# Patient Record
Sex: Female | Born: 1955 | Race: White | Hispanic: No | Marital: Married | Smoking: Never smoker
Health system: Southern US, Community
[De-identification: ages and names within clinical notes are randomized; demographics above are authoritative.]

## PROBLEM LIST (undated history)

## (undated) DIAGNOSIS — K222 Esophageal obstruction: Secondary | ICD-10-CM

## (undated) DIAGNOSIS — M1990 Unspecified osteoarthritis, unspecified site: Secondary | ICD-10-CM

## (undated) DIAGNOSIS — J189 Pneumonia, unspecified organism: Secondary | ICD-10-CM

## (undated) DIAGNOSIS — R7989 Other specified abnormal findings of blood chemistry: Secondary | ICD-10-CM

## (undated) DIAGNOSIS — E0590 Thyrotoxicosis, unspecified without thyrotoxic crisis or storm: Secondary | ICD-10-CM

## (undated) DIAGNOSIS — M8580 Other specified disorders of bone density and structure, unspecified site: Secondary | ICD-10-CM

## (undated) DIAGNOSIS — I10 Essential (primary) hypertension: Secondary | ICD-10-CM

## (undated) DIAGNOSIS — K224 Dyskinesia of esophagus: Secondary | ICD-10-CM

## (undated) DIAGNOSIS — F32A Depression, unspecified: Secondary | ICD-10-CM

## (undated) DIAGNOSIS — E785 Hyperlipidemia, unspecified: Secondary | ICD-10-CM

## (undated) DIAGNOSIS — K317 Polyp of stomach and duodenum: Secondary | ICD-10-CM

## (undated) DIAGNOSIS — Z8709 Personal history of other diseases of the respiratory system: Secondary | ICD-10-CM

## (undated) DIAGNOSIS — F329 Major depressive disorder, single episode, unspecified: Secondary | ICD-10-CM

## (undated) DIAGNOSIS — E0500 Thyrotoxicosis with diffuse goiter without thyrotoxic crisis or storm: Secondary | ICD-10-CM

## (undated) DIAGNOSIS — R55 Syncope and collapse: Secondary | ICD-10-CM

## (undated) DIAGNOSIS — Z9889 Other specified postprocedural states: Secondary | ICD-10-CM

## (undated) DIAGNOSIS — R945 Abnormal results of liver function studies: Secondary | ICD-10-CM

## (undated) DIAGNOSIS — K219 Gastro-esophageal reflux disease without esophagitis: Secondary | ICD-10-CM

## (undated) DIAGNOSIS — F419 Anxiety disorder, unspecified: Secondary | ICD-10-CM

## (undated) DIAGNOSIS — R112 Nausea with vomiting, unspecified: Secondary | ICD-10-CM

## (undated) DIAGNOSIS — Z8719 Personal history of other diseases of the digestive system: Secondary | ICD-10-CM

## (undated) DIAGNOSIS — Z8489 Family history of other specified conditions: Secondary | ICD-10-CM

## (undated) HISTORY — PX: CARPAL TUNNEL RELEASE: SHX101

## (undated) HISTORY — DX: Depression, unspecified: F32.A

## (undated) HISTORY — DX: Gastro-esophageal reflux disease without esophagitis: K21.9

## (undated) HISTORY — DX: Thyrotoxicosis, unspecified without thyrotoxic crisis or storm: E05.90

## (undated) HISTORY — DX: Other specified abnormal findings of blood chemistry: R79.89

## (undated) HISTORY — DX: Polyp of stomach and duodenum: K31.7

## (undated) HISTORY — PX: BLADDER SURGERY: SHX569

## (undated) HISTORY — PX: ABDOMINAL HYSTERECTOMY: SHX81

## (undated) HISTORY — DX: Hyperlipidemia, unspecified: E78.5

## (undated) HISTORY — DX: Dyskinesia of esophagus: K22.4

## (undated) HISTORY — DX: Essential (primary) hypertension: I10

## (undated) HISTORY — DX: Esophageal obstruction: K22.2

## (undated) HISTORY — PX: KNEE SURGERY: SHX244

## (undated) HISTORY — PX: ESOPHAGEAL DILATION: SHX303

## (undated) HISTORY — PX: NASAL SEPTUM SURGERY: SHX37

## (undated) HISTORY — DX: Thyrotoxicosis with diffuse goiter without thyrotoxic crisis or storm: E05.00

## (undated) HISTORY — DX: Major depressive disorder, single episode, unspecified: F32.9

## (undated) HISTORY — DX: Abnormal results of liver function studies: R94.5

---

## 1978-11-19 HISTORY — PX: CARPAL TUNNEL RELEASE: SHX101

## 1995-03-21 HISTORY — PX: CHOLECYSTECTOMY: SHX55

## 1997-03-20 HISTORY — PX: ELBOW SURGERY: SHX618

## 1997-07-15 ENCOUNTER — Ambulatory Visit (HOSPITAL_COMMUNITY): Admission: RE | Admit: 1997-07-15 | Discharge: 1997-07-15 | Payer: Self-pay | Admitting: Orthopedic Surgery

## 1997-07-31 ENCOUNTER — Ambulatory Visit (HOSPITAL_COMMUNITY): Admission: RE | Admit: 1997-07-31 | Discharge: 1997-07-31 | Payer: Self-pay | Admitting: Neurosurgery

## 1997-08-05 ENCOUNTER — Other Ambulatory Visit: Admission: RE | Admit: 1997-08-05 | Discharge: 1997-08-05 | Payer: Self-pay | Admitting: Obstetrics & Gynecology

## 1997-08-20 ENCOUNTER — Ambulatory Visit (HOSPITAL_COMMUNITY): Admission: RE | Admit: 1997-08-20 | Discharge: 1997-08-20 | Payer: Self-pay | Admitting: Neurosurgery

## 1997-09-03 ENCOUNTER — Ambulatory Visit (HOSPITAL_COMMUNITY): Admission: RE | Admit: 1997-09-03 | Discharge: 1997-09-03 | Payer: Self-pay | Admitting: Neurosurgery

## 1997-10-13 ENCOUNTER — Ambulatory Visit (HOSPITAL_COMMUNITY): Admission: RE | Admit: 1997-10-13 | Discharge: 1997-10-13 | Payer: Self-pay | Admitting: Internal Medicine

## 1998-03-29 ENCOUNTER — Ambulatory Visit (HOSPITAL_COMMUNITY): Admission: RE | Admit: 1998-03-29 | Discharge: 1998-03-29 | Payer: Self-pay | Admitting: Neurosurgery

## 1998-03-29 ENCOUNTER — Encounter: Payer: Self-pay | Admitting: Neurosurgery

## 1998-05-17 ENCOUNTER — Ambulatory Visit (HOSPITAL_COMMUNITY): Admission: RE | Admit: 1998-05-17 | Discharge: 1998-05-17 | Payer: Self-pay | Admitting: Neurology

## 2000-01-05 ENCOUNTER — Ambulatory Visit (HOSPITAL_COMMUNITY): Admission: RE | Admit: 2000-01-05 | Discharge: 2000-01-05 | Payer: Self-pay | Admitting: Neurosurgery

## 2000-03-16 ENCOUNTER — Emergency Department (HOSPITAL_COMMUNITY): Admission: EM | Admit: 2000-03-16 | Discharge: 2000-03-16 | Payer: Self-pay | Admitting: Emergency Medicine

## 2000-03-16 ENCOUNTER — Encounter: Payer: Self-pay | Admitting: Emergency Medicine

## 2000-06-11 ENCOUNTER — Encounter: Payer: Self-pay | Admitting: Emergency Medicine

## 2000-06-11 ENCOUNTER — Observation Stay (HOSPITAL_COMMUNITY): Admission: EM | Admit: 2000-06-11 | Discharge: 2000-06-12 | Payer: Self-pay

## 2000-06-14 ENCOUNTER — Encounter: Payer: Self-pay | Admitting: Emergency Medicine

## 2000-06-14 ENCOUNTER — Emergency Department (HOSPITAL_COMMUNITY): Admission: EM | Admit: 2000-06-14 | Discharge: 2000-06-14 | Payer: Self-pay | Admitting: Emergency Medicine

## 2000-08-07 ENCOUNTER — Other Ambulatory Visit: Admission: RE | Admit: 2000-08-07 | Discharge: 2000-08-07 | Payer: Self-pay | Admitting: Otolaryngology

## 2000-09-13 ENCOUNTER — Encounter: Payer: Self-pay | Admitting: Obstetrics and Gynecology

## 2000-09-13 ENCOUNTER — Ambulatory Visit (HOSPITAL_COMMUNITY): Admission: RE | Admit: 2000-09-13 | Discharge: 2000-09-13 | Payer: Self-pay | Admitting: Obstetrics and Gynecology

## 2000-09-21 ENCOUNTER — Encounter: Admission: RE | Admit: 2000-09-21 | Discharge: 2000-09-21 | Payer: Self-pay | Admitting: Obstetrics and Gynecology

## 2000-09-21 ENCOUNTER — Encounter: Payer: Self-pay | Admitting: Obstetrics and Gynecology

## 2001-02-28 ENCOUNTER — Ambulatory Visit (HOSPITAL_BASED_OUTPATIENT_CLINIC_OR_DEPARTMENT_OTHER): Admission: RE | Admit: 2001-02-28 | Discharge: 2001-02-28 | Payer: Self-pay | Admitting: Plastic Surgery

## 2001-10-11 ENCOUNTER — Ambulatory Visit (HOSPITAL_COMMUNITY): Admission: RE | Admit: 2001-10-11 | Discharge: 2001-10-11 | Payer: Self-pay | Admitting: Internal Medicine

## 2001-10-11 ENCOUNTER — Encounter (INDEPENDENT_AMBULATORY_CARE_PROVIDER_SITE_OTHER): Payer: Self-pay | Admitting: Specialist

## 2003-08-25 ENCOUNTER — Ambulatory Visit (HOSPITAL_COMMUNITY): Admission: RE | Admit: 2003-08-25 | Discharge: 2003-08-25 | Payer: Self-pay | Admitting: Internal Medicine

## 2003-08-28 ENCOUNTER — Ambulatory Visit (HOSPITAL_COMMUNITY): Admission: RE | Admit: 2003-08-28 | Discharge: 2003-08-28 | Payer: Self-pay | Admitting: Internal Medicine

## 2003-08-28 ENCOUNTER — Encounter: Payer: Self-pay | Admitting: Internal Medicine

## 2004-01-19 ENCOUNTER — Encounter: Payer: Self-pay | Admitting: Internal Medicine

## 2004-03-20 HISTORY — PX: FRACTURE SURGERY: SHX138

## 2004-05-05 ENCOUNTER — Other Ambulatory Visit: Admission: RE | Admit: 2004-05-05 | Discharge: 2004-05-05 | Payer: Self-pay | Admitting: Obstetrics and Gynecology

## 2004-05-18 ENCOUNTER — Emergency Department (HOSPITAL_COMMUNITY): Admission: EM | Admit: 2004-05-18 | Discharge: 2004-05-18 | Payer: Self-pay | Admitting: Emergency Medicine

## 2004-05-27 ENCOUNTER — Ambulatory Visit: Payer: Self-pay | Admitting: Internal Medicine

## 2004-09-05 ENCOUNTER — Encounter: Admission: RE | Admit: 2004-09-05 | Discharge: 2004-09-05 | Payer: Self-pay | Admitting: Neurosurgery

## 2007-04-29 ENCOUNTER — Ambulatory Visit: Payer: Self-pay | Admitting: Internal Medicine

## 2007-07-31 ENCOUNTER — Telehealth: Payer: Self-pay | Admitting: Internal Medicine

## 2007-08-08 ENCOUNTER — Telehealth: Payer: Self-pay | Admitting: Internal Medicine

## 2007-08-26 ENCOUNTER — Encounter: Admission: RE | Admit: 2007-08-26 | Discharge: 2007-08-26 | Payer: Self-pay | Admitting: Otolaryngology

## 2008-02-08 ENCOUNTER — Emergency Department (HOSPITAL_COMMUNITY): Admission: EM | Admit: 2008-02-08 | Discharge: 2008-02-08 | Payer: Self-pay | Admitting: Emergency Medicine

## 2008-02-10 ENCOUNTER — Emergency Department (HOSPITAL_COMMUNITY): Admission: EM | Admit: 2008-02-10 | Discharge: 2008-02-10 | Payer: Self-pay | Admitting: Emergency Medicine

## 2008-04-04 ENCOUNTER — Encounter: Admission: RE | Admit: 2008-04-04 | Discharge: 2008-04-04 | Payer: Self-pay | Admitting: Internal Medicine

## 2009-01-20 ENCOUNTER — Telehealth: Payer: Self-pay | Admitting: Internal Medicine

## 2009-01-20 DIAGNOSIS — Z862 Personal history of diseases of the blood and blood-forming organs and certain disorders involving the immune mechanism: Secondary | ICD-10-CM | POA: Insufficient documentation

## 2009-01-20 DIAGNOSIS — F410 Panic disorder [episodic paroxysmal anxiety] without agoraphobia: Secondary | ICD-10-CM | POA: Insufficient documentation

## 2009-01-20 DIAGNOSIS — K2970 Gastritis, unspecified, without bleeding: Secondary | ICD-10-CM | POA: Insufficient documentation

## 2009-01-20 DIAGNOSIS — D131 Benign neoplasm of stomach: Secondary | ICD-10-CM | POA: Insufficient documentation

## 2009-01-20 DIAGNOSIS — K2990 Gastroduodenitis, unspecified, without bleeding: Secondary | ICD-10-CM

## 2009-01-20 DIAGNOSIS — K222 Esophageal obstruction: Secondary | ICD-10-CM | POA: Insufficient documentation

## 2009-01-20 DIAGNOSIS — G5600 Carpal tunnel syndrome, unspecified upper limb: Secondary | ICD-10-CM | POA: Insufficient documentation

## 2009-01-20 DIAGNOSIS — K8020 Calculus of gallbladder without cholecystitis without obstruction: Secondary | ICD-10-CM | POA: Insufficient documentation

## 2009-01-20 DIAGNOSIS — K224 Dyskinesia of esophagus: Secondary | ICD-10-CM | POA: Insufficient documentation

## 2009-01-20 DIAGNOSIS — IMO0001 Reserved for inherently not codable concepts without codable children: Secondary | ICD-10-CM | POA: Insufficient documentation

## 2009-01-20 DIAGNOSIS — K219 Gastro-esophageal reflux disease without esophagitis: Secondary | ICD-10-CM | POA: Insufficient documentation

## 2009-01-20 DIAGNOSIS — Z8639 Personal history of other endocrine, nutritional and metabolic disease: Secondary | ICD-10-CM

## 2009-01-20 DIAGNOSIS — F329 Major depressive disorder, single episode, unspecified: Secondary | ICD-10-CM | POA: Insufficient documentation

## 2009-01-20 DIAGNOSIS — K589 Irritable bowel syndrome without diarrhea: Secondary | ICD-10-CM | POA: Insufficient documentation

## 2009-01-21 ENCOUNTER — Ambulatory Visit: Payer: Self-pay | Admitting: Internal Medicine

## 2009-01-21 DIAGNOSIS — R079 Chest pain, unspecified: Secondary | ICD-10-CM | POA: Insufficient documentation

## 2009-01-25 ENCOUNTER — Telehealth: Payer: Self-pay | Admitting: Internal Medicine

## 2009-01-27 ENCOUNTER — Ambulatory Visit: Payer: Self-pay | Admitting: Internal Medicine

## 2009-01-27 ENCOUNTER — Ambulatory Visit (HOSPITAL_COMMUNITY): Admission: RE | Admit: 2009-01-27 | Discharge: 2009-01-27 | Payer: Self-pay | Admitting: Internal Medicine

## 2009-02-01 ENCOUNTER — Encounter: Payer: Self-pay | Admitting: Internal Medicine

## 2009-02-19 ENCOUNTER — Telehealth: Payer: Self-pay | Admitting: Internal Medicine

## 2009-05-13 ENCOUNTER — Encounter: Payer: Self-pay | Admitting: Internal Medicine

## 2009-07-15 ENCOUNTER — Encounter: Payer: Self-pay | Admitting: Internal Medicine

## 2010-04-10 ENCOUNTER — Encounter: Payer: Self-pay | Admitting: Neurosurgery

## 2010-04-10 ENCOUNTER — Encounter: Payer: Self-pay | Admitting: Specialist

## 2010-04-21 NOTE — Miscellaneous (Signed)
Summary: Xanax Rx  Clinical Lists Changes  Medications: Changed medication from ALPRAZOLAM 0.5 MG TABS (ALPRAZOLAM) Take 1 tablet by mouth once daily for esophageal spasms to ALPRAZOLAM 0.5 MG TABS (ALPRAZOLAM) Take 1 tablet by mouth once daily for esophageal spasms - Signed Rx of ALPRAZOLAM 0.5 MG TABS (ALPRAZOLAM) Take 1 tablet by mouth once daily for esophageal spasms;  #30 x 1;  Signed;  Entered by: Lamona Curl CMA (AAMA);  Authorized by: Hart Carwin MD;  Method used: Printed then [facility] [phone]*, [address] [place], [place], [place]  [address], Ph: [phone], Fax: [phone]    Prescriptions: ALPRAZOLAM 0.5 MG TABS (ALPRAZOLAM) Take 1 tablet by mouth once daily for esophageal spasms  #30 x 1   Entered by:   Lamona Curl CMA (AAMA)   Authorized by:   Hart Carwin MD   Signed by:   Lamona Curl CMA (AAMA) on 07/15/2009   Method used:   Printed then faxed to ...       [id number]       [address]       [place], [place]  [address]       Ph: [phone]       Fax: [phone]   RxID:   [id number]

## 2010-04-21 NOTE — Miscellaneous (Signed)
Summary: Xanax Rx  Clinical Lists Changes  Medications: Rx of ALPRAZOLAM 0.5 MG TABS (ALPRAZOLAM) Take 1 tablet by mouth once daily for esophageal spasms;  #30 x 1;  Signed;  Entered by: Hortense Ramal CMA (AAMA);  Authorized by: Hart Carwin MD;  Method used: Printed then [facility]ed to [facility] [phone]*, [address], [place], [place], [place]  [address], Ph: [phone], Fax: [phone]    Prescriptions: ALPRAZOLAM 0.5 MG TABS (ALPRAZOLAM) Take 1 tablet by mouth once daily for esophageal spasms  #30 x 1   Entered by:   Hortense Ramal CMA (AAMA)   Authorized by:   Hart Carwin MD   Signed by:   Hortense Ramal CMA (AAMA) on 05/13/2009   Method used:   Printed then faxed to ...       [id number] ([facility])       [address]       [place], [place]  [address]       Ph: [phone]       Fax: [phone]   RxID:   [id number]

## 2010-07-11 ENCOUNTER — Other Ambulatory Visit: Payer: Self-pay | Admitting: *Deleted

## 2010-07-11 MED ORDER — ALPRAZOLAM 0.5 MG PO TABS: ORAL_TABLET | ORAL | Status: DC

## 2010-07-11 NOTE — Telephone Encounter (Signed)
rx sent for alprazolam.

## 2010-08-02 NOTE — Assessment & Plan Note (Signed)
 HEALTHCARE                         GASTROENTEROLOGY OFFICE NOTE   Carolyn [name]                      MRN:          [id number]  DATE:04/29/2007                            DOB:          57-Jul-1957    Carolyn [name] is a 55 year old white female with history of esophageal  spasm., gastroesophageal reflux disease, gastric polyps on upper  endoscopy 1999.  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  Her son has hemophilia.  She required cholecystectomy  1998, and multiple upper endoscopies for evaluation of esophageal  dysfunction in 1994, 1995, 2003, and the last endoscopy in June 2005.  She had a flexible sigmoidoscopy in the past, but has never had a  colonoscopy.   Patient comes today for refill of her Aciphex.   MEDICATIONS:  Aciphex 20 mg p.o. b.i.d.   PHYSICAL EXAMINATION:  Blood pressure 112/66, pulse 88, weight 154  bounds.  She was alert, oriented, no distress.  She had a normal voice,  no hoarseness.  LUNGS:  Clear to auscultation.  COR:  Normal S1, S2.  ABDOMEN:  Soft, nontender with normoactive bowel sounds, no distension.  Liver edge at costal margin.   IMPRESSION:  55-year-old white female with a nonspecific  esophageal dysmotility as per esophageal manometry and esophageal spasm,  currently under good control on Aciphex 20 mg p.o. b.i.d.   PLAN:  1. Refill for Aciphex.  2. Patient is  suitable candidate for screening colonoscopy.  I      encouraged her to schedule colonoscopy. She will let us know.     Carolyn [name]. Juanda Chance, MD  Electronically Signed    DMB/MedQ  DD: 04/29/2007  DT: 04/29/2007  Job #: [id number]   cc:   Arva Chafe, Dr.

## 2010-08-05 NOTE — H&P (Signed)
[place]. [facility]  Patient:    Carolyn [name], Carolyn [name]                        MRN: [id number] Adm. Date:  06/11/00 Attending:  Janae Bridgeman. Eloise Harman., M.D. Dictator:   Adelina Mings, P.A.                         History and Physical  DATE OF BIRTH: 15-Dec-1955  CHIEF COMPLAINT: Weakness, passed out this morning.  HISTORY OF PRESENT ILLNESS: Carolyn [name] is a 55 year old female patient followed by Dr. Higinio Plan, brought to the hospital by EMS this morning for a syncopal spell.  The patient reports that she felt very poorly on Sunday and had a headache, fatigue, some nausea, and diffuse stomachache.  She stayed on the sofa all day.  She got up this morning to go to the bathroom and had a loose stool.  After having a bowel movement she became very clammy and hot, and remembers falling onto the floor.  She reports that her husband found her in a "semiconscious" state and called 911.  She does remember that before the spell her hands began to draw up and her legs felt very weak and jerky.  She did feel somewhat short of breath after the spell but this was very short-lived.  She had been having diarrhea and loose stools after meals for about three or four days and some nausea, but no vomiting.  She denies fever or chills.  No dysuria, hematuria, urgency, or frequency.  She has also been having some left flank pain intermittently for about three weeks, which she describes as a bee sting sensation and pressure in the left flank.  It is actually aggravated while she is having intercourse with her husband.  She has no previous history of cardiac arrhythmia or seizure disorder.  She did have some problems with atypical chest pain in the mid 1990s and had an extensive cardiac evaluation by Dr. Donnie Aho which included a negative stress test. Apparently while here at the emergency room the patient suddenly became very sweaty in her hands and feet and felt palpitations  in her chest.  The monitor showed her heart rate to be over 140.  EKG confirmed sinus tachycardia with a rate of 165.  Apparently they performed carotid massage and Valsalva maneuver, which did decrease her heart rate.  Her potassium level was found to be 2.6. Sodium was 137.  She has been given 40 mEq of oral potassium and 4 runs of 10 mEq each of IV potassium.  The patient has never been on any diuretics according to our office chart. However, shes obtained a second opinion from another primary care Ramisa Duman, Dr. Ruthine Dose, last Thursday for some edema in her legs after being seen in our office on Monday.  She was started on hydrochlorothiazide 25 mg q.d.  She has been administering herself over-the-counter oral potassium for about two weeks for leg cramps.  She also discontinued her Serzone and changed her to Celexa 20 mg q.d. last Thursday.  The patient is concerned about potential liver problems with Serzone.  REVIEW OF SYSTEMS: The patient does wear glasses.  She has chronic problems with herniated disk at L5-S1 with compression of the right S1 nerve root. This causes back pain and some leg discomfort.  She has had extensive evaluations for recurrent spells of temporary hemisensory symptoms of numbness and tingling  and pain in the left side.  Apparently these were felt to be related to anxiety due to her sons illness.  He suffers from hemophilia A. She has been having lower extremity edema in her ankles for about six weeks, which is worse at the end of the day.  She reports minimal salt in her diet. She denies any exertional chest pain, shortness of breath, syncope, or history of seizure disorder.  No melena or hematochezia.  No swallowing difficulties, although she does have chronic reflux.  The Review Of Systems is otherwise negative.  CURRENT MEDICATIONS:  1. Previously on Serzone 150 mg q.a.m., 75 mg q.p.m.  2. Neurontin 300 mg three tablets q.a.m., two tablets at noon, and  three     tablets q.h.s.  3. Nexium 40 mg one tablet b.i.d.  ALLERGIES:  1. TYLOX causes nausea and vomiting.  2. EFFEXOR causes sweats.  PAST MEDICAL HISTORY:  1. Fibromyalgia.  2. C6 radiculopathy.  3. 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  4. Carpal tunnel syndrome with surgical decompression, left hand, 1995.  6. Cholelithiasis.  7. Atypical chest pain.  8. Depression with anxiety.  9. Esophageal stricture with dilation in 1996. 10. Reflux.  PAST SURGICAL HISTORY:  1. Vaginal hysterectomy for prolapse in 1983, Dr. Ambrose Mantle.  2. Esophageal dilation in 1996.  3. Carpal tunnel release, left hand, 1995.  4. Cholecystectomy in 1998, Dr. Maple Hudson.  5. Right elbow surgery in 1999, Dr. Renae Fickle.  SOCIAL HISTORY: The patient is currently married and has one son, age 21, who suffers from hemophilia A.  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  She denies use of tobacco or alcohol. 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  ______.  Her religious preference is [place].  She does drive.  FAMILY HISTORY: The patients father suffered from hemophilia A as well as a nephew who was recently diagnosed; he died at the age of 47 of an MI.  Her mother has diabetes, coronary disease, and breast cancer.  Three maternal aunts suffered from breast cancer.  A sister had angioplasty at the age of 57.  PHYSICAL EXAMINATION:  VITAL SIGNS: Age 55.  Weight here in the office last Monday was 157-1/2 pounds.  Blood pressure on admission was 122/74, pulse 86 and regular, respirations 20 and unlabored, temperature 97.5 degrees.  Oxygen saturation 98% on room air.  GENERAL: Pleasant, alert, healthy-appearing Caucasian female in no acute distress.  HEENT: Oropharynx pink and moist without redness.  Uvula midline.  Good dentition.  Face symmetric without redness.  PERRLA.  EOMI.  TMs  clear.  NECK: Supple without lymphadenopathy, thyromegaly, or JVD.  CARDIAC: Regular rate and rhythm without murmur.  CHEST: Clear to auscultation bilaterally.   BACK: Spine symmetric.  She has some focal tenderness over the left lumbosacral muscles.  No redness or rash on the trunk.  ABDOMEN: Slightly obese but soft and nondistended, with some diffuse tenderness in the lower quadrants.  No rebound or rigidity.  She has good bowel sounds throughout.  RECTAL: Examination deferred.  EXTREMITIES: Warm and slightly clammy, without edema in the periphery.  No rash.  NEUROLOGIC: DTRs 2+ and symmetric throughout.  Babinskis downgoing.  Gait is not assessed at this point as the patient is receiving IV infusion of potassium.  GU: Examination deferred.  LABORATORY DATA: Sodium 137, potassium 2.6 on admission.  Chloride 99, CO2 28, BUN 11, creatinine 0.7,  glucose high at 119.  Alkaline phosphatase 62, total bilirubin 0.7, calcium 9.4.  ALT 37, AST 29.  Urinalysis is generally benign. Hemoglobin 14.2, hematocrit 41, WBC 10.2; platelets 246,000.  EKG shows sinus tachycardia with rate of 165.  CT scan of the abdomen and pelvis shows bilateral ovarian cyst but no kidney stone.  ASSESSMENT:  1. Hypokalemia secondary to recent consumption of diuretics and mild     diarrhea.  2. Supraventricular tachycardia induced by hypokalemia.  3. Recent peripheral edema.  4. Left flank pain of unknown etiology.  5. History of depression with anxiety.  6. Reflux.  7. Herniated L5-S1 disk.  PLAN: The patient is being admitted to [address] [facility] telemetry unit, for continued potassium replacement.  A stat potassium while admitting the patient at the ER today after 40 mEq of oral potassium and two runs of 10 mEq potassium is 3.9.  Will check stat potassium in the morning. Also add serum magnesium today.  Discontinue Serzone and continue Celexa 20 mg q.d.  She is currently now  off diuretics.  Will need to discuss prescription oral replacement if she intends to continue these.  Regular low-sodium diet and routine vital signs and activity.  Admit to Dr. Lendell Caprice. DD:  06/11/00 TD:  06/12/00 Job: [id number]

## 2010-08-05 NOTE — Discharge Summary (Signed)
[place]. [facility]  Patient:    Carolyn [name], Carolyn [name]                      MRN: [id number] Adm. Date:  [id number] Disc. Date: [id number] Attending:  Vashti Hey                           Discharge Summary  DATE OF BIRTH:  1956-02-26   DISCHARGE DIAGNOSES: 1. Acute syncope. 2. Electrolyte imbalance manifested by severe hypokalemia. 3. Intolerance to thiazide diuretic. 4. Supraventricular tachycardia probably due to hypovolemia and hypokalemia. 5. Recent acute gastroenteritis.  HISTORY OF PRESENT ILLNESS:  This 55 year old, married, white female presented to the emergency room by EMS after a syncopal spell the morning of admission. She was in the bathroom having a bowel movement, got sweaty and fainted, falling onto the bathroom floor and was unconscious briefly.  She was diaphoretic and had evidence of palpitations.  When EMS attended to her, her heart rate was at 165 beats per minute.  She was transported to the emergency room and evaluation by the EDP disclosed a low potassium of 2.6 with a sodium of 137.  The patient apparently had been prescribed 25 mg of HCTZ several days before for alleged ankle and foot edema by another primary care physician. The patient had been seen in my office about a week before describing her edema, but there was in fact no edema and I declined to consider a diuretic at that time.  HOSPITAL COURSE:  The patient was placed on intravenous fluids and given several runs of intravenous potassium.  Her repeat potassiums were 3.9 and on the morning of discharge it was up to 4.0 mEq/L.  Her diarrhea ceased, her dizziness and sweating ceased, her tachycardia resolved and she remained in a sinus rhythm tolerating a regular diet and was up and about without any difficulty.  Her serum magnesium level was low normal at 1.6.  Her cardiac enzymes were negative.  Her comprehensive metabolic panel was otherwise negative.   Her troponin I was less than 0.01.  Her CBC was normal with a white count of 10,200, hemoglobin 14.2 and hematocrit 41%.  Platelet count was 246,000.  Urinalysis showed clear urine yellow, specific gravity 1.026, pH 6.5, small amount of bilirubin, 15 mg percent of ketones compatible with her semistarvation state and one unit of urobilinogen.  Her ICTO test was negative.  On her presentation to the emergency room, she also complained of left flank pain and a CT scan was performed by the EDP which was unremarkable. The patients EKG taken at 0751 hours on the morning of March 25, showed normal sinus rhythm with a rate of 69 beats per minute.  LABORATORY DATA AND X-RAY FINDINGS:  As described above.  NOSOCOMIAL INFECTIONS:  None.  TRANSFUSIONS:  None.  PROCEDURES:  None.  DISCHARGE MEDICATIONS: (Resume usual medications) 1. Celexa 20 mg daily. 2. Neurontin for her neuropathy symptoms as before. 3. H2 blocker as needed for any residual gastritis.  FOLLOWUP:  She will see me in the office in three weeks and have BMP at that time.  SPECIAL INSTRUCTIONS:  She could return to work when able which should be in the next day.  Report any further episodes of alteration of consciousness or arrhythmias.  CONDITION ON DISCHARGE:  Greatly improved. DD:  06/12/00 TD:  06/12/00 Job: [id number]

## 2010-08-05 NOTE — Procedures (Signed)
[facility]  Patient:    Carolyn [name], Carolyn [name] Visit Number: [id number] MRN: [id number]          Service Type: END Location: ENDO Attending Physician:  Mervin Hack Admit Date:  10/11/2001   CC:         Angelena Sole, M.D. [facility]   Procedure Report  PROCEDURE:  Upper endoscopy and esophageal dilation.  SURGEON:  Hedwig Morton. Juanda Chance, M.D.  ASSISTANT:  INDICATION FOR PROCEDURE:  This 55 year old white female has a history of esophageal motility disorder/esophageal spasm causing intermittent chest pain. Last endoscopy in 1999 with esophageal dilatation using 17 mm dilated relieved her chest pain as well as dysphagia.  Several weeks ago she had a recurrence of this dysphagia.  She denies any excessive stress. She has been on Nexium initially 40 mg a day, most recent 40 mg twice a day.  She has dysphagia mostly to solids, also some regurgitation of food. She is undergoing upper endoscopy and esophageal dilatation.  ENDOSCOPE:  Olympus single channel video endoscope.  SEDATION:  Versed 10 mg, IV Demerol 90 mg IV.  FINDINGS:  Olympus single channel video endoscope passed under direct vision through the posterior pharynx to the esophagus.  The patient was monitored by pulse oximeter.  Oxygen saturations were normal.  Proximal and mid esophageal mucosa was unremarkable.  There was a ___________  esophageal sphincter which had only mild resistance when endoscope passed through. There was no definite stricture. There was no hiatal hernia.  Mucosa appeared essentially normal without acute abrasions or erosions.  Stomach:  The stomach was insufflated with air and showed normal-appearing gastric folds. There were multiple tiny polyps measuring 1 to 2 mm in diameter. There were biopsied and sent to pathology.  Pyloric outlet and antrum were normal. Retroflexion of endoscope revealed normal fundus and cardia.  Duodenum:  Duodenal bulb and descending duodenum  were normal.  Endoscope was brought back into the stomach.  A guidewire was placed into the stomach and several dilators passed over the guidewire using 18 and 19 mm dilators which passed without fluoroscopic guidance.  There was no blood on the dilator. The patient tolerated the procedure well.  IMPRESSION: 1. Status post passage of 18 and 19 Savary dilators. 2. Multiple gastric polyps status post biopsies.  PLAN: 1. Resume soft diet. 2. Continue Nexium 40 mg p.o. q.d. 3. Use Valium 5 mg p.r.n. esophageal spasm. Attending Physician:  Mervin Hack DD:  10/11/01 TD:  10/15/01 Job: [id number]

## 2010-09-09 ENCOUNTER — Telehealth: Payer: Self-pay | Admitting: Internal Medicine

## 2010-09-09 MED ORDER — ESOMEPRAZOLE MAGNESIUM 40 MG PO CPDR: 40.0000 mg | DELAYED_RELEASE_CAPSULE | Freq: Two times a day (BID) | ORAL | Status: DC

## 2010-09-09 NOTE — Telephone Encounter (Signed)
Sent one refill to patient's pharmacy and pt notified to keep appt for any further refills. Pt verbalized understanding.

## 2010-09-12 ENCOUNTER — Other Ambulatory Visit: Payer: Self-pay | Admitting: *Deleted

## 2010-09-12 MED ORDER — ALPRAZOLAM 0.5 MG PO TABS: ORAL_TABLET | ORAL | Status: DC

## 2010-10-24 ENCOUNTER — Encounter: Payer: Self-pay | Admitting: Internal Medicine

## 2010-10-24 ENCOUNTER — Ambulatory Visit (INDEPENDENT_AMBULATORY_CARE_PROVIDER_SITE_OTHER): Payer: PRIVATE HEALTH INSURANCE | Admitting: Internal Medicine

## 2010-10-24 VITALS — BP 132/76 | HR 88 | Ht 64.0 in | Wt 157.0 lb

## 2010-10-24 DIAGNOSIS — K224 Dyskinesia of esophagus: Secondary | ICD-10-CM

## 2010-10-24 DIAGNOSIS — K219 Gastro-esophageal reflux disease without esophagitis: Secondary | ICD-10-CM

## 2010-10-24 MED ORDER — ALPRAZOLAM 0.5 MG PO TABS: ORAL_TABLET | ORAL | Status: DC

## 2010-10-24 NOTE — Progress Notes (Signed)
Carolyn [name] 08/28/1955 MRN [id number]    History of Present Illness:  This is a 55 year old white female with gastroesophageal reflux and esophageal spasm who comes for refills of her medications. She has been on OTC Prevacid 30 mg twice a day and occasional TUMS. She denies chest pain or dysphagia. She takes Xanax  0.5 mg when necessary for esophageal spasm. She had a flexible sigmoidoscopy in 1998.   Past Medical History  Diagnosis Date  . History of gastritis   . Cholelithiasis   . Esophageal stricture   . Panic disorder   . Depression   . Carpal tunnel syndrome   . Fibromyalgia   . Abnormal LFTs   . IBS (irritable bowel syndrome)   . Gastric polyp   . GERD (gastroesophageal reflux disease)   . Esophageal spasm   . Hypertension    Past Surgical History  Procedure Date  . Carpal tunnel release   . Abdominal hysterectomy   . Knee surgery     right  . Cholecystectomy   . Elbow surgery     reports that she has never smoked. She has never used smokeless tobacco. She reports that she does not drink alcohol or use illicit drugs. family history includes Breast cancer in her mother; Clotting disorder in her son; Heart disease in her father; and Prostate cancer in her paternal grandfather.  There is no history of Colon cancer. Allergies  Allergen Reactions  . Hydrocodone-Acetaminophen         Review of Systems: Denies chest pain, shortness of breath, abdominal pain. He had an episode of all viral gastroenteritis last week  The remainder of the 10  point ROS is negative except as outlined in H&P   Physical Exam: General appearance  Well developed, in no distress. Eyes- non icteric. HEENT nontraumatic, normocephalic. Mouth no lesions, tongue papillated, no cheilosis. Neck supple without adenopathy, thyroid not enlarged, no carotid bruits, no JVD. Lungs Clear to auscultation bilaterally. Cor normal S1 normal S2, regular rhythm , no murmur,  quiet precordium. Abdomen  soft abdomen with normal active bowel sounds. Epigastric tenderness and left lower quadrant tenderness. Liver edge at the costal margin, post cholecystectomy scar. Rectal: Not done. Extremities no pedal edema. Skin no lesions. Neurological alert and oriented x 3. Psychological normal mood and affect.  Assessment and Plan:  Problem #1 gastroesophageal reflux and esophageal spasm managed on pantoprazole 30 mg twice a day and Xanax 0.5 mg daily we will give refills of this.  Problem #2 colorectal screening. She is due for a screening colonoscopy. We will send her a recall letter in September 2012 for an October 2012 colonoscopy.    10/24/2010 Lina Sar

## 2010-10-24 NOTE — Patient Instructions (Addendum)
We will send refills of Xanax to your pharmacy. You will be due for a recall colonoscopy in October 2012. We will send you a reminder in the mail when it gets closer to that time. CC: Dr Pearson Grippe

## 2010-12-20 LAB — DIFFERENTIAL
Basophils Absolute: 0.1
Basophils Relative: 1
Eosinophils Absolute: 0
Neutro Abs: 4.9
Neutrophils Relative %: 63

## 2010-12-20 LAB — D-DIMER, QUANTITATIVE
D-Dimer, Quant: 0.28
D-Dimer, Quant: 0.37

## 2010-12-20 LAB — POCT I-STAT, CHEM 8
Chloride: 101
Glucose, Bld: 103 — ABNORMAL HIGH
HCT: 40
Hemoglobin: 13.6
Potassium: 3.6

## 2010-12-20 LAB — POCT CARDIAC MARKERS
CKMB, poc: 1.4
Myoglobin, poc: 48.7

## 2010-12-20 LAB — CBC
MCHC: 34.3
MCV: 91.2
Platelets: 221
RDW: 12.2

## 2011-06-26 ENCOUNTER — Other Ambulatory Visit: Payer: Self-pay | Admitting: *Deleted

## 2011-06-26 MED ORDER — ALPRAZOLAM 0.5 MG PO TABS: ORAL_TABLET | ORAL | Status: DC

## 2011-08-18 ENCOUNTER — Other Ambulatory Visit: Payer: Self-pay | Admitting: *Deleted

## 2011-08-18 MED ORDER — ALPRAZOLAM 0.5 MG PO TABS: ORAL_TABLET | ORAL | Status: DC

## 2012-01-02 ENCOUNTER — Encounter: Payer: Self-pay | Admitting: Internal Medicine

## 2012-01-15 ENCOUNTER — Other Ambulatory Visit: Payer: Self-pay | Admitting: Internal Medicine

## 2012-01-30 ENCOUNTER — Encounter: Payer: Self-pay | Admitting: Internal Medicine

## 2012-04-15 ENCOUNTER — Other Ambulatory Visit (HOSPITAL_COMMUNITY): Payer: Self-pay | Admitting: Cardiology

## 2012-04-15 DIAGNOSIS — Z87898 Personal history of other specified conditions: Secondary | ICD-10-CM

## 2012-04-15 DIAGNOSIS — R002 Palpitations: Secondary | ICD-10-CM

## 2012-04-15 DIAGNOSIS — R609 Edema, unspecified: Secondary | ICD-10-CM

## 2012-04-15 DIAGNOSIS — I10 Essential (primary) hypertension: Secondary | ICD-10-CM

## 2012-05-01 ENCOUNTER — Inpatient Hospital Stay (HOSPITAL_COMMUNITY): Admission: RE | Admit: 2012-05-01 | Payer: PRIVATE HEALTH INSURANCE | Source: Ambulatory Visit

## 2012-06-20 ENCOUNTER — Encounter: Payer: Self-pay | Admitting: Internal Medicine

## 2012-09-18 ENCOUNTER — Telehealth: Payer: Self-pay | Admitting: *Deleted

## 2012-09-18 MED ORDER — METOPROLOL TARTRATE 25 MG PO TABS: ORAL_TABLET | ORAL | Status: DC

## 2012-09-18 NOTE — Telephone Encounter (Signed)
Patient came by the office.She wanted to know if there is any medication cheaper than Bystolic. It cost her $75 for 30 pills. Per Dr Herbie Baltimore ,she can try METOPROLOL TART 12.5 MG b.i.d.She is aware and she would like prescription sent to [facility] at [facility] #30 tablets.  Pt states she is out of BYSTOLIC 2.5 mg ,gave her a sample box of 7 tablets to split in half.  LOT [id number] exp 11/15. Discontinue Bystolic

## 2012-11-13 ENCOUNTER — Other Ambulatory Visit (HOSPITAL_COMMUNITY): Payer: Self-pay | Admitting: Cardiology

## 2012-11-14 NOTE — Telephone Encounter (Signed)
Rx was sent to pharmacy electronically. 

## 2013-05-09 ENCOUNTER — Encounter: Payer: Self-pay | Admitting: *Deleted

## 2013-05-13 ENCOUNTER — Ambulatory Visit: Payer: PRIVATE HEALTH INSURANCE | Admitting: Cardiology

## 2013-05-13 ENCOUNTER — Other Ambulatory Visit (HOSPITAL_COMMUNITY): Payer: Self-pay | Admitting: Cardiology

## 2013-05-13 NOTE — Telephone Encounter (Signed)
Rx was sent to pharmacy electronically. 

## 2013-05-13 NOTE — Telephone Encounter (Signed)
Entered in error

## 2013-05-20 ENCOUNTER — Encounter: Payer: Self-pay | Admitting: Cardiology

## 2013-05-29 ENCOUNTER — Other Ambulatory Visit: Payer: Self-pay | Admitting: *Deleted

## 2013-05-29 ENCOUNTER — Other Ambulatory Visit (HOSPITAL_COMMUNITY): Payer: Self-pay | Admitting: Cardiology

## 2013-05-29 MED ORDER — METOPROLOL TARTRATE 25 MG PO TABS
ORAL_TABLET | ORAL | Status: DC
Start: 2013-05-29 — End: 2013-09-24

## 2013-06-02 NOTE — Telephone Encounter (Signed)
Mel[facility] from [facility] was calling in regards to Ms. Menser's Maxide. They faxed over a refill request and they have not heard anything yet.  [place]

## 2013-06-02 NOTE — Telephone Encounter (Signed)
Rx was sent to pharmacy electronically. 

## 2013-06-03 ENCOUNTER — Ambulatory Visit (INDEPENDENT_AMBULATORY_CARE_PROVIDER_SITE_OTHER): Payer: PRIVATE HEALTH INSURANCE | Admitting: Cardiology

## 2013-06-03 ENCOUNTER — Encounter: Payer: Self-pay | Admitting: Cardiology

## 2013-06-03 VITALS — BP 142/88 | HR 67 | Ht 63.0 in | Wt 157.0 lb

## 2013-06-03 DIAGNOSIS — R600 Localized edema: Secondary | ICD-10-CM | POA: Insufficient documentation

## 2013-06-03 DIAGNOSIS — E785 Hyperlipidemia, unspecified: Secondary | ICD-10-CM

## 2013-06-03 DIAGNOSIS — Z8249 Family history of ischemic heart disease and other diseases of the circulatory system: Secondary | ICD-10-CM

## 2013-06-03 DIAGNOSIS — R609 Edema, unspecified: Secondary | ICD-10-CM

## 2013-06-03 DIAGNOSIS — I10 Essential (primary) hypertension: Secondary | ICD-10-CM

## 2013-06-03 NOTE — Assessment & Plan Note (Signed)
Father had MI 20"s

## 2013-06-03 NOTE — Progress Notes (Signed)
06/03/2013 Carolyn [name]   03-Jan-1956  [id number]  Primary Physicia [name], MD Primary Cardiologist: Dr Ellyn Hack  HPI: The pt is a 58 year old woman who was a former patient of Dr. Eddie Dibbles.   She has a history as follows: 1. Hypertension that has been relatively difficult to control in the past and labile. Seems to be relatively well controlled now.  2. History of tachycardia. No documented arrhythmias, but with being on the beta blocker, this has resolved. 3. Chronic lower extremity edema, relatively stable as long as she is taking her fluid pills.  4. Dyslipidemia, had been on pravastatin and Zocor, but I am not sure if both of those have been stopped. 5. History of syncope in the past.  6. She has chronic back pain and what sounds like irritable bowel syndrome.   She is here for routine check up. She and her husband [place] and have an 12 y/o son. They both ride [employer] and are active. She denies any chest pain or SOB. She has not had her lipids checked in 3 yrs or so and I suggested we do that. She has a strong family history of CAD.    Current Outpatient Prescriptions  Medication Sig Dispense Refill  . ALPRAZolam (XANAX) 0.5 MG tablet Take 1 tablet by mouth once daily for esophageal spasms  45 tablet  1  . aspirin EC 81 MG tablet Take 81 mg by mouth daily.      . Lansoprazole (PREVACID PO) Take by mouth 2 (two) times daily.        . methocarbamol (ROBAXIN) 500 MG tablet       . metoprolol tartrate (LOPRESSOR) 25 MG tablet Take 1/2 tablet by mouth twice a day  30 tablet  11  . traMADol (ULTRAM) 50 MG tablet       . triamterene-hydrochlorothiazide (MAXZIDE-25) 37.5-25 MG per tablet TAKE 1 TABLET EVERY DAY  30 tablet  1  . esomeprazole (NEXIUM) 40 MG capsule Take 1 capsule (40 mg total) by mouth 2 (two) times daily.  60 capsule  0   No current facility-administered medications for this visit.    Allergies  Allergen Reactions  . Hydrocodone-Acetaminophen      History   Social History  . Marital Status: Married    Spouse Name: N/A    Number of Children: N/A  . Years of Education: N/A   Occupational History  . [employer]    Social History Main Topics  . Smoking status: Never Smoker   . Smokeless tobacco: Never Used  . Alcohol Use: No  . Drug Use: No  . Sexual Activity: Not on file   Other Topics Concern  . Not on file   Social History Narrative  . No narrative on file     Review of Systems: General: negative for chills, fever, night sweats or weight changes.  Cardiovascular: negative for chest pain, dyspnea on exertion, edema, orthopnea, palpitations, paroxysmal nocturnal dyspnea or shortness of breath Dermatological: negative for rash Respiratory: negative for cough or wheezing Urologic: negative for hematuria Abdominal: negative for nausea, vomiting, diarrhea, bright red blood per rectum, melena, or hematemesis Neurologic: negative for visual changes, syncope, or dizziness All other systems reviewed and are otherwise negative except as noted above.    Blood pressure 142/88, pulse 67, height 5\' 3"  (1.6 m), weight 157 lb (71.215 kg).  General appearance: alert, cooperative and no distress Neck: no carotid bruit and no JVD Lungs: clear  to auscultation bilaterally Heart: regular rate and rhythm Extremities: no edema  EKG NSR, TWI V2 and V3  Incomplete RBBB  ASSESSMENT AND PLAN:   HTN (hypertension) Controlled  Family history of coronary artery disease Father had MI 74"s  Lower extremity edema Controlled with low dose diuretics   PLAN  Check lipids, Rx if LDL > 100. One year follow up.  An Schnabel KPA-C 06/03/2013 9:01 AM

## 2013-06-03 NOTE — Assessment & Plan Note (Signed)
Controlled.  

## 2013-06-03 NOTE — Patient Instructions (Signed)
Your physician recommends that you schedule a follow-up appointment in: one year with Dr Ellyn Hack Your physician recommends that you have a FASTING lipid profile:

## 2013-06-03 NOTE — Assessment & Plan Note (Signed)
Controlled with low dose diuretics

## 2013-08-19 ENCOUNTER — Other Ambulatory Visit: Payer: Self-pay | Admitting: *Deleted

## 2013-08-19 MED ORDER — TRIAMTERENE-HCTZ 37.5-25 MG PO TABS: 1.0000 | ORAL_TABLET | Freq: Every day | ORAL | Status: DC

## 2013-08-19 NOTE — Telephone Encounter (Signed)
Rx was sent to pharmacy electronically. 

## 2013-08-22 ENCOUNTER — Telehealth: Payer: Self-pay | Admitting: *Deleted

## 2013-08-22 NOTE — Telephone Encounter (Signed)
FAXED CLEARANCE  FOR RIGHT KNEE :RT [facility]IAL,RIGHT KNEE SCOPE,LATERAL COMP DEB SCHEDULE FOR 10/06/2013 PER DR HARDING CLEARED WITH NO RECOMMENDATION.

## 2013-09-24 ENCOUNTER — Encounter (HOSPITAL_COMMUNITY): Payer: Self-pay | Admitting: Pharmacy Technician

## 2013-09-28 NOTE — H&P (Signed)
UNICOMPARTMENTAL KNEE ADMISSION H&P  Patient is being admitted for right medial unicompartmental knee arthroplasty.  Subjective:  Chief Complaint:   Right knee pain.  HPI: AYNSLEE MULHALL, 58 y.o. female, has a history of pain and functional disability in the right knee due to arthritis and has failed non-surgical conservative treatments for greater than 12 weeks to includeNSAID's and/or analgesics, corticosteriod injections and activity modification.  Onset of symptoms was gradual, starting 2 years ago with gradually worsening course since that time. The patient noted prior procedures on the knee to include  menisectomy on the right knee(s).  Patient currently rates pain in the right knee(s) at 7 out of 10 with activity. Patient has night pain, worsening of pain with activity and weight bearing, pain that interferes with activities of daily living, pain with passive range of motion, crepitus and joint swelling.  Patient has evidence of periarticular osteophytes and joint space narrowing of the medial compartment by imaging studies.  There is no active infection.  Risks, benefits and expectations were discussed with the patient.  Risks including but not limited to the risk of anesthesia, blood clots, nerve damage, blood vessel damage, failure of the prosthesis, infection and up to and including death.  Patient understand the risks, benefits and expectations and wishes to proceed with surgery.   PCP: [name], MD  D/C Plans:      Home with HHPT  Post-op Meds:       No Rx given   Tranexamic Acid:      To be given - IV    Decadron:      Is to be given  FYI:     ASA post-op  Tramadol and APAP post-op   Patient Active Problem List   Diagnosis Date Noted  . HTN (hypertension) 06/03/2013  . Family history of coronary artery disease 06/03/2013  . Lower extremity edema 06/03/2013  . CHEST PAIN 01/21/2009  . GASTRIC POLYP 01/20/2009  . PANIC DISORDER 01/20/2009  . DEPRESSION 01/20/2009  .  CARPAL TUNNEL SYNDROME 01/20/2009  . ESOPHAGEAL STRICTURE 01/20/2009  . ESOPHAGEAL SPASM 01/20/2009  . GERD 01/20/2009  . GASTRITIS 01/20/2009  . IRRITABLE BOWEL SYNDROME 01/20/2009  . CHOLELITHIASIS 01/20/2009  . FIBROMYALGIA 01/20/2009  . LIVER FUNCTION TESTS, ABNORMAL, HX OF 01/20/2009   Past Medical History  Diagnosis Date  . History of gastritis   . Cholelithiasis   . Esophageal stricture   . Panic disorder   . Depression   . Carpal tunnel syndrome   . Fibromyalgia   . Abnormal LFTs   . IBS (irritable bowel syndrome)   . Gastric polyp   . GERD (gastroesophageal reflux disease)   . Esophageal spasm   . Hypertension   . Hyperlipidemia     Past Surgical History  Procedure Laterality Date  . Carpal tunnel release    . Abdominal hysterectomy    . Knee surgery      right  . Cholecystectomy    . Elbow surgery      No prescriptions prior to admission   Allergies  Allergen Reactions  . Hydrocodone-Acetaminophen Nausea And Vomiting  . Other     Anesthesia makes her vomit    History  Substance Use Topics  . Smoking status: Never Smoker   . Smokeless tobacco: Never Used  . Alcohol Use: No    Family History  Problem Relation Age of Onset  . Heart disease Father   . Breast cancer Mother   . Prostate cancer Paternal Grandfather   .  Clotting disorder Son     hemophilia  . Colon cancer Neg Hx      Review of Systems  Constitutional: Negative.   HENT: Negative.   Eyes: Negative.   Respiratory: Negative.   Cardiovascular: Negative.   Gastrointestinal: Positive for heartburn.  Genitourinary: Negative.   Musculoskeletal: Negative.   Skin: Negative.   Neurological: Negative.   Endo/Heme/Allergies: Negative.   Psychiatric/Behavioral: Positive for depression. The patient is nervous/anxious.     Objective:  Physical Exam  Constitutional: She is oriented to person, place, and time. She appears well-developed and well-nourished.  HENT:  Head: Normocephalic and  atraumatic.  Mouth/Throat: Oropharynx is clear and moist.  Eyes: Pupils are equal, round, and reactive to light.  Neck: Neck supple. No JVD present. No tracheal deviation present. No thyromegaly present.  Cardiovascular: Normal rate, regular rhythm, normal heart sounds and intact distal pulses.   Respiratory: Effort normal and breath sounds normal. No stridor. No respiratory distress. She has no wheezes.  GI: Soft. There is no tenderness. There is no guarding.  Musculoskeletal:       Right knee: She exhibits decreased range of motion and swelling. She exhibits no ecchymosis, no deformity, no laceration and no erythema. Tenderness found. Medial joint line tenderness noted. No lateral joint line tenderness noted.  Lymphadenopathy:    She has no cervical adenopathy.  Neurological: She is alert and oriented to person, place, and time.  Skin: Skin is warm and dry.  Psychiatric: She has a normal mood and affect.     Labs:  Estimated body mass index is 27.82 kg/(m^2) as calculated from the following:   Height as of 06/03/13: 5\' 3"  (1.6 m).   Weight as of 06/03/13: 71.215 kg (157 lb).   Imaging Review Plain radiographs demonstrate severe degenerative joint disease of the right knee medial compartment. The overall alignment is neutral. The bone quality appears to be good for age and reported activity level.  Assessment/Plan:  End stage arthritis, right knee   The patient history, physical examination, clinical judgment of the provider and imaging studies are consistent with end stage degenerative joint disease of the right knee(s) and medial unicompartmental knee arthroplasty is deemed medically necessary. The treatment options including medical management, injection therapy arthroscopy and arthroplasty were discussed at length. The risks and benefits of total knee arthroplasty were presented and reviewed. The risks due to aseptic loosening, infection, stiffness, patella tracking problems,  thromboembolic complications and other imponderables were discussed. The patient acknowledged the explanation, agreed to proceed with the plan and consent was signed. Patient is being admitted for inpatient treatment for surgery, pain control, PT, OT, prophylactic antibiotics, VTE prophylaxis, progressive ambulation and ADL's and discharge planning. The patient is planning to be discharged home with home health services.    [place] Pugh Trevante Tennell   PA-C  09/28/2013, 12:49 PM

## 2013-09-30 ENCOUNTER — Encounter (HOSPITAL_COMMUNITY)
Admission: RE | Admit: 2013-09-30 | Discharge: 2013-09-30 | Disposition: A | Discharge status: Home / Self Care | Payer: PRIVATE HEALTH INSURANCE | Source: Ambulatory Visit | Attending: Orthopedic Surgery | Admitting: Orthopedic Surgery

## 2013-09-30 ENCOUNTER — Other Ambulatory Visit (HOSPITAL_COMMUNITY): Payer: Self-pay | Admitting: Orthopedic Surgery

## 2013-09-30 ENCOUNTER — Ambulatory Visit (HOSPITAL_COMMUNITY)
Admission: RE | Admit: 2013-09-30 | Discharge: 2013-09-30 | Disposition: A | Discharge status: Home / Self Care | Payer: PRIVATE HEALTH INSURANCE | Source: Ambulatory Visit | Attending: Anesthesiology | Admitting: Anesthesiology

## 2013-09-30 ENCOUNTER — Encounter (HOSPITAL_COMMUNITY): Payer: Self-pay

## 2013-09-30 DIAGNOSIS — Z01812 Encounter for preprocedural laboratory examination: Secondary | ICD-10-CM | POA: Insufficient documentation

## 2013-09-30 DIAGNOSIS — Z01810 Encounter for preprocedural cardiovascular examination: Secondary | ICD-10-CM | POA: Insufficient documentation

## 2013-09-30 DIAGNOSIS — Z01818 Encounter for other preprocedural examination: Secondary | ICD-10-CM | POA: Insufficient documentation

## 2013-09-30 DIAGNOSIS — R05 Cough: Secondary | ICD-10-CM | POA: Insufficient documentation

## 2013-09-30 DIAGNOSIS — R059 Cough, unspecified: Secondary | ICD-10-CM | POA: Insufficient documentation

## 2013-09-30 HISTORY — DX: Personal history of other diseases of the respiratory system: Z87.09

## 2013-09-30 HISTORY — DX: Other specified postprocedural states: Z98.890

## 2013-09-30 HISTORY — DX: Unspecified osteoarthritis, unspecified site: M19.90

## 2013-09-30 HISTORY — DX: Syncope and collapse: R55

## 2013-09-30 HISTORY — DX: Other specified disorders of bone density and structure, unspecified site: M85.80

## 2013-09-30 HISTORY — DX: Nausea with vomiting, unspecified: R11.2

## 2013-09-30 HISTORY — DX: Anxiety disorder, unspecified: F41.9

## 2013-09-30 LAB — BASIC METABOLIC PANEL
Anion gap: 10 (ref 5–15)
BUN: 12 mg/dL (ref 6–23)
CALCIUM: 9.8 mg/dL (ref 8.4–10.5)
CO2: 30 mEq/L (ref 19–32)
CREATININE: 0.63 mg/dL (ref 0.50–1.10)
Chloride: 101 mEq/L (ref 96–112)
GLUCOSE: 107 mg/dL — AB (ref 70–99)
Potassium: 4 mEq/L (ref 3.7–5.3)
Sodium: 141 mEq/L (ref 137–147)

## 2013-09-30 LAB — URINALYSIS, ROUTINE W REFLEX MICROSCOPIC
Bilirubin Urine: NEGATIVE
Glucose, UA: NEGATIVE mg/dL
HGB URINE DIPSTICK: NEGATIVE
Ketones, ur: NEGATIVE mg/dL
Nitrite: NEGATIVE
Protein, ur: NEGATIVE mg/dL
Specific Gravity, Urine: 1.015 (ref 1.005–1.030)
UROBILINOGEN UA: 1 mg/dL (ref 0.0–1.0)
pH: 8 (ref 5.0–8.0)

## 2013-09-30 LAB — SURGICAL PCR SCREEN
MRSA, PCR: NEGATIVE
STAPHYLOCOCCUS AUREUS: POSITIVE — AB

## 2013-09-30 LAB — PROTIME-INR
INR: 0.91 (ref 0.00–1.49)
Prothrombin Time: 12.3 seconds (ref 11.6–15.2)

## 2013-09-30 LAB — CBC
HCT: 38 % (ref 36.0–46.0)
Hemoglobin: 13.1 g/dL (ref 12.0–15.0)
MCH: 30.3 pg (ref 26.0–34.0)
MCHC: 34.5 g/dL (ref 30.0–36.0)
MCV: 87.8 fL (ref 78.0–100.0)
PLATELETS: 250 10*3/uL (ref 150–400)
RBC: 4.33 MIL/uL (ref 3.87–5.11)
RDW: 12.1 % (ref 11.5–15.5)
WBC: 6.1 10*3/uL (ref 4.0–10.5)

## 2013-09-30 LAB — APTT: aPTT: 34 seconds (ref 24–37)

## 2013-09-30 LAB — ABO/RH: ABO/RH(D): O POS

## 2013-09-30 LAB — URINE MICROSCOPIC-ADD ON

## 2013-09-30 NOTE — Progress Notes (Signed)
Clearance Dr Ellyn Hack chart, ekg 3/15 epic

## 2013-09-30 NOTE — Progress Notes (Signed)
Mupriocin 2% ointment called into [facility] on [address]. Sherry at Dr. Aurea Graff office was informed. Pt informed of prescription. No questions or concerns.

## 2013-09-30 NOTE — Patient Instructions (Addendum)
20 Carolyn [name]  09/30/2013   Your procedure is scheduled on: Monday 10/06/13  Report to [facility] at 05:30 AM.  Call this number if you have problems the morning of surgery [phone]: [phone]   Remember:   Do not eat food or drink liquids After Midnight.     Take these medicines the morning of surgery with A SIP OF WATER: metoprolol, xanax, prevacid   Do not wear jewelry, make-up or nail polish.  Do not wear lotions, powders, or perfumes. You may wear deodorant.  Do not shave 48 hours prior to surgery. Men may shave face and neck.  Do not bring valuables to the hospital.  Contacts, dentures or bridgework may not be worn into surgery.  Leave suitcase in the car. After surgery it may be brought to your room.  For patients admitted to the hospital, checkout time is 11:00 AM the day of discharge.   Please read over the following fact sheets that you were given: MRSA Information Paulette Blanch, RN  pre op nurse call if needed [phone]    [facility] - Preparing for Surgery Before surgery, you can play an important role.  Because skin is not sterile, your skin needs to be as free of germs as possible.  You can reduce the number of germs on your skin by washing with CHG (chlorahexidine gluconate) soap before surgery.  CHG is an antiseptic cleaner which kills germs and bonds with the skin to continue killing germs even after washing. Please DO NOT use if you have an allergy to CHG or antibacterial soaps.  If your skin becomes reddened/irritated stop using the CHG and inform your nurse when you arrive at Short Stay. Do not shave (including legs and underarms) for at least 48 hours prior to the first CHG shower.  You may shave your face/neck. Please follow these instructions carefully:  1.  Shower with CHG Soap the night before surgery and the  morning of Surgery.  2.  If you choose to wash your hair, wash your hair first as usual with your  normal  shampoo.  3.  After you  shampoo, rinse your hair and body thoroughly to remove the  shampoo.                            4.  Use CHG as you would any other liquid soap.  You can apply chg directly  to the skin and wash                       Gently with a scrungie or clean washcloth.  5.  Apply the CHG Soap to your body ONLY FROM THE NECK DOWN.   Do not use on face/ open                           Wound or open sores. Avoid contact with eyes, ears mouth and genitals (private parts).                       Wash face,  Genitals (private parts) with your normal soap.             6.  Wash thoroughly, paying special attention to the area where your surgery  will be performed.  7.  Thoroughly rinse your body with warm water from the neck down.  8.  DO NOT shower/wash with your normal soap after using and rinsing off  the CHG Soap.                9.  Pat yourself dry with a clean towel.            10.  Wear clean pajamas.            11.  Place clean sheets on your bed the night of your first shower and do not  sleep with pets. Day of Surgery : Do not apply any lotions/deodorants the morning of surgery.  Please wear clean clothes to the hospital/surgery center.  FAILURE TO FOLLOW THESE INSTRUCTIONS MAY RESULT IN THE CANCELLATION OF YOUR SURGERY PATIENT SIGNATURE_________________________________  NURSE SIGNATURE__________________________________  ________________________________________________________________________  WHAT IS A BLOOD TRANSFUSION? Blood Transfusion Information  A transfusion is the replacement of blood or some of its parts. Blood is made up of multiple cells which provide different functions.  Red blood cells carry oxygen and are used for blood loss replacement.  White blood cells fight against infection.  Platelets control bleeding.  Plasma helps clot blood.  Other blood products are available for specialized needs, such as hemophilia or other clotting disorders. BEFORE THE TRANSFUSION  Who gives  blood for transfusions?   Healthy volunteers who are fully evaluated to make sure their blood is safe. This is blood bank blood. Transfusion therapy is the safest it has ever been in the practice of medicine. Before blood is taken from a donor, a complete history is taken to make sure that person has no history of diseases nor engages in risky social behavior (examples are intravenous drug use or sexual activity with multiple partners). The donor's travel history is screened to minimize risk of transmitting infections, such as malaria. The donated blood is tested for signs of infectious diseases, such as HIV and hepatitis. The blood is then tested to be sure it is compatible with you in order to minimize the chance of a transfusion reaction. If you or a relative donates blood, this is often done in anticipation of surgery and is not appropriate for emergency situations. It takes many days to process the donated blood. RISKS AND COMPLICATIONS Although transfusion therapy is very safe and saves many lives, the main dangers of transfusion include:   Getting an infectious disease.  Developing a transfusion reaction. This is an allergic reaction to something in the blood you were given. Every precaution is taken to prevent this. The decision to have a blood transfusion has been considered carefully by your caregiver before blood is given. Blood is not given unless the benefits outweigh the risks. AFTER THE TRANSFUSION  Right after receiving a blood transfusion, you will usually feel much better and more energetic. This is especially true if your red blood cells have gotten low (anemic). The transfusion raises the level of the red blood cells which carry oxygen, and this usually causes an energy increase.  The nurse administering the transfusion will monitor you carefully for complications. HOME CARE INSTRUCTIONS  No special instructions are needed after a transfusion. You may find your energy is better.  Speak with your caregiver about any limitations on activity for underlying diseases you may have. SEEK MEDICAL CARE IF:   Your condition is not improving after your transfusion.  You develop redness or irritation at the intravenous (IV) site. SEEK IMMEDIATE MEDICAL CARE IF:  Any of the following symptoms occur over the next 12 hours:  Shaking chills.  You  have a temperature by mouth above 102 F (38.9 C), not controlled by medicine.  Chest, back, or muscle pain.  People around you feel you are not acting correctly or are confused.  Shortness of breath or difficulty breathing.  Dizziness and fainting.  You get a rash or develop hives.  You have a decrease in urine output.  Your urine turns a dark color or changes to pink, red, or brown. Any of the following symptoms occur over the next 10 days:  You have a temperature by mouth above 102 F (38.9 C), not controlled by medicine.  Shortness of breath.  Weakness after normal activity.  The white part of the eye turns yellow (jaundice).  You have a decrease in the amount of urine or are urinating less often.  Your urine turns a dark color or changes to pink, red, or brown. Document Released: 03/03/2000 Document Revised: 05/29/2011 Document Reviewed: 10/21/2007 Exit[employer] Patient Information 2014 [place].  _______________________________________________________________________  Incentive Spirometer  An incentive spirometer is a tool that can help keep your lungs clear and active. This tool measures how well you are filling your lungs with each breath. Taking long deep breaths may help reverse or decrease the chance of developing breathing (pulmonary) problems (especially infection) following:  A long period of time when you are unable to move or be active. BEFORE THE PROCEDURE   If the spirometer includes an indicator to show your best effort, your nurse or respiratory therapist will set it to a desired goal.  If  possible, sit up straight or lean slightly forward. Try not to slouch.  Hold the incentive spirometer in an upright position. INSTRUCTIONS FOR USE  1. Sit on the edge of your bed if possible, or sit up as far as you can in bed or on a chair. 2. Hold the incentive spirometer in an upright position. 3. Breathe out normally. 4. Place the mouthpiece in your mouth and seal your lips tightly around it. 5. Breathe in slowly and as deeply as possible, raising the piston or the ball toward the top of the column. 6. Hold your breath for 3-5 seconds or for as long as possible. Allow the piston or ball to fall to the bottom of the column. 7. Remove the mouthpiece from your mouth and breathe out normally. 8. Rest for a few seconds and repeat Steps 1 through 7 at least 10 times every 1-2 hours when you are awake. Take your time and take a few normal breaths between deep breaths. 9. The spirometer may include an indicator to show your best effort. Use the indicator as a goal to work toward during each repetition. 10. After each set of 10 deep breaths, practice coughing to be sure your lungs are clear. If you have an incision (the cut made at the time of surgery), support your incision when coughing by placing a pillow or rolled up towels firmly against it. Once you are able to get out of bed, walk around indoors and cough well. You may stop using the incentive spirometer when instructed by your caregiver.  RISKS AND COMPLICATIONS  Take your time so you do not get dizzy or light-headed.  If you are in pain, you may need to take or ask for pain medication before doing incentive spirometry. It is harder to take a deep breath if you are having pain. AFTER USE  Rest and breathe slowly and easily.  It can be helpful to keep track of a log of your  progress. Your caregiver can provide you with a simple table to help with this. If you are using the spirometer at home, follow these instructions: [place]  IF:   You are having difficultly using the spirometer.  You have trouble using the spirometer as often as instructed.  Your pain medication is not giving enough relief while using the spirometer.  You develop fever of 100.5 F (38.1 C) or higher. SEEK IMMEDIATE MEDICAL CARE IF:   You cough up bloody sputum that had not been present before.  You develop fever of 102 F (38.9 C) or greater.  You develop worsening pain at or near the incision site. MAKE SURE YOU:   Understand these instructions.  Will watch your condition.  Will get help right away if you are not doing well or get worse. Document Released: 07/17/2006 Document Revised: 05/29/2011 Document Reviewed: 09/17/2006 [facility] Patient Information 2014 [place], [place].   ________________________________________________________________________

## 2013-10-06 ENCOUNTER — Encounter (HOSPITAL_COMMUNITY): Payer: PRIVATE HEALTH INSURANCE | Admitting: Anesthesiology

## 2013-10-06 ENCOUNTER — Ambulatory Visit (HOSPITAL_COMMUNITY): Payer: PRIVATE HEALTH INSURANCE | Admitting: Anesthesiology

## 2013-10-06 ENCOUNTER — Encounter (HOSPITAL_COMMUNITY)
Admission: RE | Disposition: A | Discharge status: Home / Self Care | Payer: Self-pay | Source: Ambulatory Visit | Attending: Orthopedic Surgery

## 2013-10-06 ENCOUNTER — Ambulatory Visit (HOSPITAL_COMMUNITY)
Admission: RE | Admit: 2013-10-06 | Discharge: 2013-10-07 | Disposition: A | Discharge status: Home / Self Care | Payer: PRIVATE HEALTH INSURANCE | Source: Ambulatory Visit | Attending: Orthopedic Surgery | Admitting: Orthopedic Surgery

## 2013-10-06 ENCOUNTER — Encounter (HOSPITAL_COMMUNITY): Payer: Self-pay

## 2013-10-06 DIAGNOSIS — I10 Essential (primary) hypertension: Secondary | ICD-10-CM | POA: Insufficient documentation

## 2013-10-06 DIAGNOSIS — K219 Gastro-esophageal reflux disease without esophagitis: Secondary | ICD-10-CM | POA: Insufficient documentation

## 2013-10-06 DIAGNOSIS — F411 Generalized anxiety disorder: Secondary | ICD-10-CM | POA: Insufficient documentation

## 2013-10-06 DIAGNOSIS — Z9889 Other specified postprocedural states: Secondary | ICD-10-CM

## 2013-10-06 DIAGNOSIS — F329 Major depressive disorder, single episode, unspecified: Secondary | ICD-10-CM | POA: Insufficient documentation

## 2013-10-06 DIAGNOSIS — Z96651 Presence of right artificial knee joint: Secondary | ICD-10-CM

## 2013-10-06 DIAGNOSIS — E785 Hyperlipidemia, unspecified: Secondary | ICD-10-CM | POA: Insufficient documentation

## 2013-10-06 DIAGNOSIS — M1710 Unilateral primary osteoarthritis, unspecified knee: Secondary | ICD-10-CM | POA: Insufficient documentation

## 2013-10-06 DIAGNOSIS — M2240 Chondromalacia patellae, unspecified knee: Secondary | ICD-10-CM | POA: Insufficient documentation

## 2013-10-06 DIAGNOSIS — F3289 Other specified depressive episodes: Secondary | ICD-10-CM | POA: Insufficient documentation

## 2013-10-06 DIAGNOSIS — M23302 Other meniscus derangements, unspecified lateral meniscus, unspecified knee: Secondary | ICD-10-CM | POA: Insufficient documentation

## 2013-10-06 HISTORY — PX: KNEE ARTHROSCOPY: SHX127

## 2013-10-06 HISTORY — PX: PARTIAL KNEE ARTHROPLASTY: SHX2174

## 2013-10-06 LAB — TYPE AND SCREEN
ABO/RH(D): O POS
ANTIBODY SCREEN: NEGATIVE

## 2013-10-06 SURGERY — ARTHROPLASTY, KNEE, UNICOMPARTMENTAL
Anesthesia: Spinal | Site: Knee | Laterality: Right

## 2013-10-06 MED ORDER — SODIUM CHLORIDE 0.9 % IV SOLN
INTRAVENOUS | Status: DC
  Administered 2013-10-06: 12:00:00 via INTRAVENOUS
  Filled 2013-10-06 (×4): qty 1000

## 2013-10-06 MED ORDER — CEFAZOLIN SODIUM-DEXTROSE 2-3 GM-% IV SOLR
2.0000 g | INTRAVENOUS | Status: AC
  Administered 2013-10-06: 2 g via INTRAVENOUS

## 2013-10-06 MED ORDER — METOCLOPRAMIDE HCL 10 MG PO TABS: 5.0000 mg | ORAL_TABLET | Freq: Three times a day (TID) | ORAL | Status: DC | PRN

## 2013-10-06 MED ORDER — PHENOL 1.4 % MT LIQD
1.0000 | OROMUCOSAL | Status: DC | PRN
  Filled 2013-10-06: qty 177

## 2013-10-06 MED ORDER — FENTANYL CITRATE 0.05 MG/ML IJ SOLN
INTRAMUSCULAR | Status: DC | PRN
Start: 2013-10-06 — End: 2013-10-06
  Administered 2013-10-06: 100 ug via INTRAVENOUS

## 2013-10-06 MED ORDER — ACETAMINOPHEN 650 MG RE SUPP: 650.0000 mg | Freq: Four times a day (QID) | RECTAL | Status: DC | PRN

## 2013-10-06 MED ORDER — HYDROMORPHONE HCL PF 1 MG/ML IJ SOLN: 0.5000 mg | INTRAMUSCULAR | Status: DC | PRN

## 2013-10-06 MED ORDER — MAGNESIUM CITRATE PO SOLN: 1.0000 | Freq: Once | ORAL | Status: AC | PRN

## 2013-10-06 MED ORDER — BUPIVACAINE IN DEXTROSE 0.75-8.25 % IT SOLN
INTRATHECAL | Status: DC | PRN
  Administered 2013-10-06: 2 mL via INTRATHECAL

## 2013-10-06 MED ORDER — KETOROLAC TROMETHAMINE 30 MG/ML IJ SOLN
INTRAMUSCULAR | Status: AC
  Filled 2013-10-06: qty 1

## 2013-10-06 MED ORDER — BUPIVACAINE-EPINEPHRINE 0.25% -1:200000 IJ SOLN
INTRAMUSCULAR | Status: DC | PRN
  Administered 2013-10-06: 30 mL

## 2013-10-06 MED ORDER — PANTOPRAZOLE SODIUM 20 MG PO TBEC
20.0000 mg | DELAYED_RELEASE_TABLET | Freq: Every day | ORAL | Status: DC
  Filled 2013-10-06: qty 1

## 2013-10-06 MED ORDER — BUPIVACAINE-EPINEPHRINE (PF) 0.25% -1:200000 IJ SOLN
INTRAMUSCULAR | Status: AC
  Filled 2013-10-06: qty 30

## 2013-10-06 MED ORDER — ALPRAZOLAM 0.5 MG PO TABS
0.5000 mg | ORAL_TABLET | Freq: Every day | ORAL | Status: DC
  Administered 2013-10-07: 0.5 mg via ORAL
  Filled 2013-10-06: qty 1

## 2013-10-06 MED ORDER — ONDANSETRON HCL 4 MG/2ML IJ SOLN
INTRAMUSCULAR | Status: DC | PRN
  Administered 2013-10-06: 4 mg via INTRAVENOUS

## 2013-10-06 MED ORDER — ALUM & MAG HYDROXIDE-SIMETH 200-200-20 MG/5ML PO SUSP: 30.0000 mL | ORAL | Status: DC | PRN

## 2013-10-06 MED ORDER — TRIAMTERENE-HCTZ 37.5-25 MG PO CAPS
1.0000 | ORAL_CAPSULE | Freq: Every morning | ORAL | Status: DC
  Administered 2013-10-07: 1 via ORAL
  Filled 2013-10-06 (×2): qty 1

## 2013-10-06 MED ORDER — METOPROLOL TARTRATE 12.5 MG HALF TABLET
12.5000 mg | ORAL_TABLET | Freq: Two times a day (BID) | ORAL | Status: DC
  Administered 2013-10-07: 12.5 mg via ORAL
  Filled 2013-10-06 (×3): qty 1

## 2013-10-06 MED ORDER — BISACODYL 10 MG RE SUPP: 10.0000 mg | Freq: Every day | RECTAL | Status: DC | PRN

## 2013-10-06 MED ORDER — ACETAMINOPHEN 325 MG PO TABS: 650.0000 mg | ORAL_TABLET | Freq: Four times a day (QID) | ORAL | Status: DC | PRN

## 2013-10-06 MED ORDER — BUPIVACAINE LIPOSOME 1.3 % IJ SUSP
20.0000 mL | Freq: Once | INTRAMUSCULAR | Status: AC
  Administered 2013-10-06: 20 mL
  Filled 2013-10-06: qty 20

## 2013-10-06 MED ORDER — LACTATED RINGERS IV SOLN: INTRAVENOUS | Status: DC

## 2013-10-06 MED ORDER — ASPIRIN EC 325 MG PO TBEC
325.0000 mg | DELAYED_RELEASE_TABLET | Freq: Two times a day (BID) | ORAL | Status: DC
  Administered 2013-10-07: 325 mg via ORAL
  Filled 2013-10-06 (×3): qty 1

## 2013-10-06 MED ORDER — DIPHENHYDRAMINE HCL 25 MG PO CAPS: 25.0000 mg | ORAL_CAPSULE | Freq: Four times a day (QID) | ORAL | Status: DC | PRN

## 2013-10-06 MED ORDER — 0.9 % SODIUM CHLORIDE (POUR BTL) OPTIME
TOPICAL | Status: DC | PRN
  Administered 2013-10-06: 1000 mL

## 2013-10-06 MED ORDER — FENTANYL CITRATE 0.05 MG/ML IJ SOLN
INTRAMUSCULAR | Status: AC
  Filled 2013-10-06: qty 2

## 2013-10-06 MED ORDER — METHOCARBAMOL 500 MG PO TABS
500.0000 mg | ORAL_TABLET | Freq: Four times a day (QID) | ORAL | Status: DC | PRN
  Administered 2013-10-06 (×2): 500 mg via ORAL
  Filled 2013-10-06 (×2): qty 1

## 2013-10-06 MED ORDER — DEXTROSE 5 % IV SOLN
500.0000 mg | Freq: Four times a day (QID) | INTRAVENOUS | Status: DC | PRN
  Administered 2013-10-06: 500 mg via INTRAVENOUS
  Filled 2013-10-06: qty 5

## 2013-10-06 MED ORDER — DOCUSATE SODIUM 100 MG PO CAPS
100.0000 mg | ORAL_CAPSULE | Freq: Two times a day (BID) | ORAL | Status: DC
  Administered 2013-10-07: 100 mg via ORAL

## 2013-10-06 MED ORDER — DEXAMETHASONE SODIUM PHOSPHATE 10 MG/ML IJ SOLN
INTRAMUSCULAR | Status: AC
  Filled 2013-10-06: qty 1

## 2013-10-06 MED ORDER — SODIUM CHLORIDE 0.9 % IJ SOLN
INTRAMUSCULAR | Status: AC
  Filled 2013-10-06: qty 10

## 2013-10-06 MED ORDER — ONDANSETRON HCL 4 MG PO TABS
4.0000 mg | ORAL_TABLET | Freq: Four times a day (QID) | ORAL | Status: DC | PRN
Start: 2013-10-06 — End: 2013-10-07
  Administered 2013-10-06: 4 mg via ORAL
  Filled 2013-10-06: qty 1

## 2013-10-06 MED ORDER — ONDANSETRON HCL 4 MG/2ML IJ SOLN: 4.0000 mg | Freq: Four times a day (QID) | INTRAMUSCULAR | Status: DC | PRN

## 2013-10-06 MED ORDER — TRANEXAMIC ACID 100 MG/ML IV SOLN
1000.0000 mg | Freq: Once | INTRAVENOUS | Status: AC
  Administered 2013-10-06: 1000 mg via INTRAVENOUS
  Filled 2013-10-06: qty 10

## 2013-10-06 MED ORDER — CEFAZOLIN SODIUM-DEXTROSE 2-3 GM-% IV SOLR
INTRAVENOUS | Status: AC
  Filled 2013-10-06: qty 50

## 2013-10-06 MED ORDER — PROPOFOL 10 MG/ML IV BOLUS
INTRAVENOUS | Status: AC
  Filled 2013-10-06: qty 20

## 2013-10-06 MED ORDER — DEXAMETHASONE SODIUM PHOSPHATE 10 MG/ML IJ SOLN
10.0000 mg | Freq: Once | INTRAMUSCULAR | Status: DC
  Filled 2013-10-06: qty 1

## 2013-10-06 MED ORDER — TRAMADOL HCL 50 MG PO TABS
50.0000 mg | ORAL_TABLET | Freq: Four times a day (QID) | ORAL | Status: DC
  Administered 2013-10-06 – 2013-10-07 (×4): 100 mg via ORAL
  Filled 2013-10-06 (×4): qty 2

## 2013-10-06 MED ORDER — LACTATED RINGERS IR SOLN
Status: DC | PRN
  Administered 2013-10-06: 3000 mL

## 2013-10-06 MED ORDER — POLYETHYLENE GLYCOL 3350 17 G PO PACK
17.0000 g | PACK | Freq: Two times a day (BID) | ORAL | Status: DC
  Administered 2013-10-07: 17 g via ORAL

## 2013-10-06 MED ORDER — MIDAZOLAM HCL 5 MG/5ML IJ SOLN
INTRAMUSCULAR | Status: DC | PRN
  Administered 2013-10-06: 2 mg via INTRAVENOUS

## 2013-10-06 MED ORDER — LIDOCAINE HCL (CARDIAC) 20 MG/ML IV SOLN
INTRAVENOUS | Status: DC | PRN
  Administered 2013-10-06: 50 mg via INTRAVENOUS

## 2013-10-06 MED ORDER — MENTHOL 3 MG MT LOZG
1.0000 | LOZENGE | OROMUCOSAL | Status: DC | PRN
  Filled 2013-10-06: qty 9

## 2013-10-06 MED ORDER — METOCLOPRAMIDE HCL 5 MG/ML IJ SOLN: 5.0000 mg | Freq: Three times a day (TID) | INTRAMUSCULAR | Status: DC | PRN

## 2013-10-06 MED ORDER — FERROUS SULFATE 325 (65 FE) MG PO TABS
325.0000 mg | ORAL_TABLET | Freq: Three times a day (TID) | ORAL | Status: DC
  Administered 2013-10-06 – 2013-10-07 (×2): 325 mg via ORAL
  Filled 2013-10-06 (×6): qty 1

## 2013-10-06 MED ORDER — LIDOCAINE HCL (CARDIAC) 20 MG/ML IV SOLN
INTRAVENOUS | Status: AC
  Filled 2013-10-06: qty 5

## 2013-10-06 MED ORDER — HYDROMORPHONE HCL PF 1 MG/ML IJ SOLN: 0.2500 mg | INTRAMUSCULAR | Status: DC | PRN

## 2013-10-06 MED ORDER — PROPOFOL INFUSION 10 MG/ML OPTIME
INTRAVENOUS | Status: DC | PRN
  Administered 2013-10-06: 100 ug/kg/min via INTRAVENOUS

## 2013-10-06 MED ORDER — PROMETHAZINE HCL 25 MG/ML IJ SOLN: 6.2500 mg | INTRAMUSCULAR | Status: DC | PRN

## 2013-10-06 MED ORDER — DEXAMETHASONE SODIUM PHOSPHATE 10 MG/ML IJ SOLN
10.0000 mg | Freq: Once | INTRAMUSCULAR | Status: AC
  Administered 2013-10-06: 10 mg via INTRAVENOUS

## 2013-10-06 MED ORDER — PANTOPRAZOLE SODIUM 20 MG PO TBEC
20.0000 mg | DELAYED_RELEASE_TABLET | Freq: Every day | ORAL | Status: DC
  Administered 2013-10-07: 20 mg via ORAL
  Filled 2013-10-06: qty 1

## 2013-10-06 MED ORDER — CELECOXIB 200 MG PO CAPS
200.0000 mg | ORAL_CAPSULE | Freq: Two times a day (BID) | ORAL | Status: DC
  Administered 2013-10-06 – 2013-10-07 (×3): 200 mg via ORAL
  Filled 2013-10-06 (×6): qty 1

## 2013-10-06 MED ORDER — SODIUM CHLORIDE 0.9 % IJ SOLN
INTRAMUSCULAR | Status: DC | PRN
  Administered 2013-10-06: 9 mL

## 2013-10-06 MED ORDER — KETOROLAC TROMETHAMINE 30 MG/ML IJ SOLN
INTRAMUSCULAR | Status: DC | PRN
  Administered 2013-10-06: 30 mg

## 2013-10-06 MED ORDER — ONDANSETRON HCL 4 MG/2ML IJ SOLN
INTRAMUSCULAR | Status: AC
  Filled 2013-10-06: qty 2

## 2013-10-06 MED ORDER — MIDAZOLAM HCL 2 MG/2ML IJ SOLN
INTRAMUSCULAR | Status: AC
  Filled 2013-10-06: qty 2

## 2013-10-06 MED ORDER — LACTATED RINGERS IV SOLN
INTRAVENOUS | Status: DC | PRN
  Administered 2013-10-06 (×3): via INTRAVENOUS

## 2013-10-06 MED ORDER — CEFAZOLIN SODIUM-DEXTROSE 2-3 GM-% IV SOLR
2.0000 g | Freq: Four times a day (QID) | INTRAVENOUS | Status: AC
  Administered 2013-10-06 (×2): 2 g via INTRAVENOUS
  Filled 2013-10-06 (×2): qty 50

## 2013-10-06 SURGICAL SUPPLY — 68 items
ADH SKN CLS APL DERMABOND .7 (GAUZE/BANDAGES/DRESSINGS) ×1
BAG SPEC THK2 15X12 ZIP CLS (MISCELLANEOUS)
BAG ZIPLOCK 12X15 (MISCELLANEOUS) IMPLANT
BANDAGE ELASTIC 6 VELCRO ST LF (GAUZE/BANDAGES/DRESSINGS) ×2 IMPLANT
BANDAGE ESMARK 6X9 LF (GAUZE/BANDAGES/DRESSINGS) ×1 IMPLANT
BLADE CUDA SHAVER 3.5 (BLADE) ×2 IMPLANT
BLADE SAW RECIPROCATING 77.5 (BLADE) ×2 IMPLANT
BLADE SAW SGTL 13.0X1.19X90.0M (BLADE) ×2 IMPLANT
BNDG CMPR 9X6 STRL LF SNTH (GAUZE/BANDAGES/DRESSINGS) ×1
BNDG ESMARK 6X9 LF (GAUZE/BANDAGES/DRESSINGS) ×2
BOWL SMART MIX CTS (DISPOSABLE) ×2 IMPLANT
CAPT KNEE OXFORD ×1 IMPLANT
CEMENT HV SMART SET (Cement) ×2 IMPLANT
CLOTH BEACON ORANGE TIMEOUT ST (SAFETY) ×1 IMPLANT
COUNTER NEEDLE 20 DBL MAG RED (NEEDLE) ×1 IMPLANT
CUFF TOURN SGL QUICK 34 (TOURNIQUET CUFF) ×2
CUFF TRNQT CYL 34X4X40X1 (TOURNIQUET CUFF) ×1 IMPLANT
DERMABOND ADVANCED (GAUZE/BANDAGES/DRESSINGS) ×1
DERMABOND ADVANCED .7 DNX12 (GAUZE/BANDAGES/DRESSINGS) ×1 IMPLANT
DRAPE EXTREMITY T 121X128X90 (DRAPE) ×1 IMPLANT
DRAPE POUCH INSTRU U-SHP 10X18 (DRAPES) ×2 IMPLANT
DRAPE U-SHAPE 47X51 STRL (DRAPES) ×2 IMPLANT
DRSG AQUACEL AG ADV 3.5X10 (GAUZE/BANDAGES/DRESSINGS) ×2 IMPLANT
DRSG EMULSION OIL 3X3 NADH (GAUZE/BANDAGES/DRESSINGS) ×1 IMPLANT
DRSG TEGADERM 4X4.75 (GAUZE/BANDAGES/DRESSINGS) ×1 IMPLANT
DURAPREP 26ML APPLICATOR (WOUND CARE) ×3 IMPLANT
ELECT REM PT RETURN 9FT ADLT (ELECTROSURGICAL) ×2
ELECTRODE REM PT RTRN 9FT ADLT (ELECTROSURGICAL) ×1 IMPLANT
EVACUATOR 1/8 PVC DRAIN (DRAIN) IMPLANT
FACESHIELD WRAPAROUND (MASK) ×8 IMPLANT
FACESHIELD WRAPAROUND OR TEAM (MASK) ×4 IMPLANT
GAUZE SPONGE 2X2 8PLY STRL LF (GAUZE/BANDAGES/DRESSINGS) IMPLANT
GLOVE BIOGEL PI IND STRL 7.5 (GLOVE) ×1 IMPLANT
GLOVE BIOGEL PI IND STRL 8 (GLOVE) ×1 IMPLANT
GLOVE BIOGEL PI INDICATOR 7.5 (GLOVE) ×2
GLOVE BIOGEL PI INDICATOR 8 (GLOVE) ×2
GLOVE ECLIPSE 7.5 STRL STRAW (GLOVE) ×2 IMPLANT
GLOVE ECLIPSE 8.0 STRL XLNG CF (GLOVE) ×2 IMPLANT
GLOVE ORTHO TXT STRL SZ7.5 (GLOVE) ×5 IMPLANT
GOWN SPEC L3 XXLG W/TWL (GOWN DISPOSABLE) ×3 IMPLANT
GOWN STRL REUS W/TWL LRG LVL3 (GOWN DISPOSABLE) ×2 IMPLANT
GOWN STRL REUS W/TWL XL LVL3 (GOWN DISPOSABLE) ×1 IMPLANT
KIT BASIN OR (CUSTOM PROCEDURE TRAY) ×2 IMPLANT
LEGGING LITHOTOMY PAIR STRL (DRAPES) ×2 IMPLANT
MANIFOLD NEPTUNE II (INSTRUMENTS) ×2 IMPLANT
NDL SAFETY ECLIPSE 18X1.5 (NEEDLE) ×1 IMPLANT
NEEDLE HYPO 18GX1.5 SHARP (NEEDLE) ×4
PACK ARTHROSCOPY WL (CUSTOM PROCEDURE TRAY) ×2 IMPLANT
PACK TOTAL JOINT (CUSTOM PROCEDURE TRAY) ×2 IMPLANT
PAD MASON LEG HOLDER (PIN) ×1 IMPLANT
PADDING CAST COTTON 6X4 STRL (CAST SUPPLIES) ×1 IMPLANT
SET ARTHROSCOPY TUBING (MISCELLANEOUS) ×2
SET ARTHROSCOPY TUBING LN (MISCELLANEOUS) ×1 IMPLANT
SPONGE GAUZE 2X2 STER 10/PKG (GAUZE/BANDAGES/DRESSINGS) ×1
SUCTION FRAZIER TIP 10 FR DISP (SUCTIONS) ×2 IMPLANT
SUT ETHILON 4 0 PS 2 18 (SUTURE) ×2 IMPLANT
SUT MNCRL AB 4-0 PS2 18 (SUTURE) ×2 IMPLANT
SUT VIC AB 1 CT1 36 (SUTURE) ×2 IMPLANT
SUT VIC AB 2-0 CT1 27 (SUTURE) ×4
SUT VIC AB 2-0 CT1 TAPERPNT 27 (SUTURE) ×2 IMPLANT
SUT VLOC 180 0 24IN GS25 (SUTURE) ×2 IMPLANT
SYR 30ML LL (SYRINGE) ×1 IMPLANT
SYRINGE 12CC LL (MISCELLANEOUS) ×1 IMPLANT
SYRINGE 60CC LL (MISCELLANEOUS) ×2 IMPLANT
TOWEL OR 17X26 10 PK STRL BLUE (TOWEL DISPOSABLE) ×2 IMPLANT
TOWEL OR NON WOVEN STRL DISP B (DISPOSABLE) IMPLANT
TRAY FOLEY CATH 14FRSI W/METER (CATHETERS) ×1 IMPLANT
WRAP KNEE MAXI GEL POST OP (GAUZE/BANDAGES/DRESSINGS) ×2 IMPLANT

## 2013-10-06 NOTE — Op Note (Signed)
NAME: Carolyn [name]    MEDICAL RECORD NO.: [id number]   FACILITY: [facility] BIRTH: 1955-10-22  PHYSICIAN: Pietro Cassis. Alvan Dame, M.D.    DATE OF PROCEDURE: 10/06/2013    OPERATIVE REPORT   PREOPERATIVE DIAGNOSIS: Right knee medial compartment osteoarthritis.   POSTOPERATIVE DIAGNOSIS: Right knee medial compartment osteoarthritis.  PROCEDURE: Right partial knee replacement utilizing Biomet Oxford knee  component, size small femur, a right medial size AA tibial tray with a size 4 mm insert.   SURGEON: Pietro Cassis. Alvan Dame, M.D.   ASSISTANT: Danae Orleans, PAC.  Please note that [name] was present for the entirety of the case,  utilized for preoperative positioning, perioperative retractor  management, general facilitation of the case and primary wound closure.   ANESTHESIA: Spinal.   SPECIMENS: None.   COMPLICATIONS: None.  DRAINS: 1 medium HV   TOURNIQUET TIME: 36 minutes at 250 mmHg.   INDICATIONS FOR PROCEDURE: The patient is a 58 yo female patient of mine who presented for evaluation of right knee pain.  They presented with primary complaints of pain on the medial side of their knee. Radiographs revealed advanced medial compartment arthritis with specifically an antero-medial wear pattern.  There was pre-operative MRI ordered that revealed lateral meniscus tearing with mild degenerative changes otherwise noted.  There was bone on bone changes noted medially with subchondral sclerosis and osteophytes present. The patient has had progressive problems failing to respond to conservative measures of medications, injections and activity modification. Risks of infection, DVT, component failure, need for future revision surgery were all discussed and reviewed.  Consent was obtained for benefit of pain relief.   PROCEDURE IN DETAIL: The patient was brought to the operative theater.  Once adequate anesthesia, preoperative antibiotics, 2gm of Ancef administered, the patient was  positioned in supine position with a right thigh tourniquet  placed. The right lower extremity was prepped and draped in sterile  fashion with the leg on the Oxford leg holder.  The leg was allowed to flex to 120 degrees. A time-out  was performed identifying the patient, planned procedure, and extremity.  The leg was exsanguinated, tourniquet elevated to 250 mmHg. A midline  incision was made from the proximal pole of the patella to the tibial tubercle. A  soft tissue plane was created and partial median arthrotomy was then  made to allow for subluxation of the patella. Following initial synovectomy and  debridement, the osteophytes were removed off the medial aspect of the  knee.   Attention was first directed to the tibia. The tibial  extramedullary guide was positioned over the anterior crest of the tibia  and pinned into position, and using a measured resection guide from the  [place] system, a 4 mm resection was made off the proximal tibia. First  the reciprocating saw along the medial aspect of the tibial spines, then the oscillating saw.    At this point, I sized this cut surface seem to be best fit for a size AA tibial tray.  With the retractors out of the wound and the knee held at 90 degrees the 4 feeler gauge had appropriate tension on the medial ligament.   At this point, the femoral canal was opened with a drill and the  intramedullary rod passed. Then using the guide for a small posterior resection off  the posterior aspect of the femur was positioned over the mid portion of the medial femoral condyle.  The orientation was set using the guide that mates the  femoral guide to the intramedullary rod.  The 2 drill holes were made into the distal femur.  The posterior guide was then impacted into place and the posterior  femoral cut made.  At this point, I milled the distal femur with a size 4 spigot in place. At this point, we did a trial reduction of the small femur, size AA  tibial tray and a size 4 feeler gauge insert. At 20 degrees of flexion the knee was slightly tighter with the 4 feeler gauge but equal to flexion with the 3 feeler gauge.  Given the difference in the tension between the knee in 90 degrees versus that in 20 degrees I had to place the 5 spigot into the femur and re-mill the distal femur.  Remaining bone was removed and debrided.  I repeated the trial reduction and found that now at both 90 degrees and 20 degrees the knee ligament were tension symmetrically.  Given these findings, the trial femoral component was removed. Final preparation of tibia was carried out by pinning it in position. Then  using a reciprocating saw I removed bone for the keel. Further bone was  removed with an osteotome.  Trial reduction was now carried out with the small femur, the AA keeled tibia, and a size 4 lollipop insert. The balance of the  ligaments appeared to be symmetric at 20 degrees and 90 degrees. Given  all these findings, the trial components were removed.   Cement was mixed. The final components were opened. The knee was irrigated with  normal saline solution. The medial synovial capsular junction was injected with 60cc solution of Exparel, Marcaine, 1cc of Toradol.  Then final debridements of the  soft tissue was carried out, I also drilled the sclerotic bone with a drill.  The final components were cemented with a single batch of cement in a  two-stage technique with the tibial component cemented first. The knee  was then brought  to 45 degrees of flexion with a 4 feeler gauge, held with pressure for a minute and half.  After this the femoral component was cemented in place.  The knee was again held at 45 degrees of flexion while the cement fully cured.  Excess cement was removed throughout the knee. Tourniquet was let down  after 36 minutes. After the cement had fully cured and excessive cement  was removed throughout the knee there was no visualized cement  present.   The final size 4 right medial insert to match the small femur was chosen and snapped into position. We re-irrigated  the knee. The extensor mechanism  was then reapproximated using a #1 Vicryl with the knee in flexion. The  remaining wound was closed with 2-0 Vicryl and a running 4-0 Monocryl.  The knee was cleaned, dried, and dressed sterilely using Dermabond and  Aquacel dressing. The patient  was brought to the recovery room, Ace wrap in place, tolerating the  procedure well. He will be in the hospital for overnight observation.  We will initiate physical therapy and progress to ambulate.     Pietro Cassis Alvan Dame, M.D.

## 2013-10-06 NOTE — Op Note (Signed)
[name], [name] NO.:  [id number]  MEDICAL RECORD NO.:  [id number]  LOCATION:  [facility]                         FACILITY:  [facility]  PHYSICIAN:  Pietro Cassis. Alvan Dame, M.D.  DATE OF BIRTH:  June 23, 1955  DATE OF PROCEDURE:  10/06/2013 DATE OF DISCHARGE:                              OPERATIVE REPORT   PREOPERATIVE DIAGNOSIS:  Right knee medial compartment osteoarthritis associated with lateral meniscal tear by MRI.  POSTOPERATIVE DIAGNOSIS/FINDINGS: 1. Extensive grade 4 changes, medial compartment of the knee is     expected. 2. Lateral meniscal tear in the central portion and posterior horn and     anterior horn tears. 3. 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  PROCEDURES: 1. Right knee arthroscopy with diagnostic and operative arthroscopy     with lateral partial meniscectomy. 2. Right partial medial compartment arthroplasty that will be in the     Epic system via template.  SURGEON:  Pietro Cassis. Alvan Dame, M.D.  ASSISTANT:  Danae Orleans, PA-C.  Note that [name] was only utilized for the portion of the partial knee arthroplasty not for arthroscopy.  ANESTHESIA:  Spinal.  SPECIMENS:  None.  COMPLICATIONS:  None.  Tourniquet for the procedure was utilized for only the partial knee arthroplasty was for 36 minutes at 250 mmHg.  DRAINS:  None.  INJECTABLES:  The use of Exparel and Marcaine for the partial knee arthroplasty.  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  Workup prior to my evaluation including MRI which revealed some lateral meniscal pathology.  I reviewed with her that she was being excellent candidate for partial knee arthroplasty based on radiographic and clinical presentation, but that arthroscopic surgery could be done first to address  limited any potential concerns of meniscal pathology laterally in this setting.  Risks and benefits of the procedure were discussed including anesthetic risk, infection, DVT, component failure, progressive arthritic change, and need for future revision surgery. Consent was obtained for benefit of pain relief and improved function.  PROCEDURE IN DETAIL:  The patient was brought to operative theater. Once adequate anesthesia, preoperative antibiotics, Ancef administered, she was positioned supine with the right thigh tourniquet placed.  The right lower extremity was then draped over the Oxford leg holder to allow for 120 degrees of flexion for the partial knee component of this case.  Once positioned, the right lower extremity was then prepped and draped in a sterile fashion.  Time-out was performed identifying the patient, planned procedures, and the extremity.  Attention is now directed to the arthroscopic portion of the case. Standard inferior lateral, inferior medial portals were utilized.  The inferior medial portal was utilized in the predetermined location for my incision for the partial knee arthroplasty.  The camera was introduced into the anterolateral portal.  The diagnostic evaluation of the knee revealed the above findings which included the patellofemoral mild grade 2 changes and lateral meniscal tear and lateral grade 2 changes. Extensive grade 4 changes recommended as medially anticipated for the second  procedure.  First, following the probe examination, I used a straight-biting basket and then a 3.5 Cuda shaver to perform the partial meniscectomy in the central portion of the posterior horn and the anterior horn of lateral meniscus.  Once I was satisfied with this debridement back to a stable level, and further examination removing meniscal fragments, the instrumentation was removed from the knee.  I did shave a little bit of the patellofemoral change in the lateral  facet of the patella, but again this is very mild grade 2-3 changes.  Once the instrumentation was removed, I reapproximated the lateral portal site with a 4-0 nylon.  At this point, the arthroscopic instrumentation was removed off the field.  After cleaning the right knee, we exsanguinated the leg and elevated the tourniquet for a 250 mmHg for the partial knee component of the case.  Please note that this will be a separate operative note in the Epic system.     Pietro Cassis Alvan Dame, M.D.     MDO/MEDQ  D:  10/06/2013  T:  10/06/2013  Job:  [id number]

## 2013-10-06 NOTE — Transfer of Care (Signed)
Immediate Anesthesia Transfer of Care Note  Patient: INESHA SOW  Procedure(s) Performed: Procedure(s): RIGHT MEDIAL UNICOMPARTMENTAL KNEE ARTHROPLASTY (Right) RIGHT KNEE ARTHROSCOPY LATERAL COMPARTMENTAL DEBRIDEMENT, PARTIAL LATERAL MENISCECTOMY (Right)  Patient Location: PACU  Anesthesia Type:Regional  Level of Consciousness: awake, alert  and oriented  Airway & Oxygen Therapy: Patient Spontanous Breathing and Patient connected to face mask oxygen  Post-op Assessment: Report given to PACU RN and Post -op Vital signs reviewed and stable  Post vital signs: Reviewed and stable  Complications: No apparent anesthesia complications

## 2013-10-06 NOTE — Anesthesia Preprocedure Evaluation (Addendum)
Anesthesia Evaluation  Patient identified by MRN, date of birth, ID band Patient awake    Reviewed: Allergy & Precautions, H&P , NPO status , Patient's Chart, lab work & pertinent test results  Airway Mallampati: II TM Distance: >3 FB Neck ROM: Full    Dental  (+) Teeth Intact, Dental Advisory Given   Pulmonary neg pulmonary ROS,    Pulmonary exam normal       Cardiovascular hypertension, Pt. on medications and Pt. on home beta blockers negative cardio ROS      Neuro/Psych Anxiety Depression negative neurological ROS  negative psych ROS   GI/Hepatic Neg liver ROS, GERD-  ,  Endo/Other  negative endocrine ROS  Renal/GU negative Renal ROS  negative genitourinary   Musculoskeletal negative musculoskeletal ROS (+)   Abdominal   Peds negative pediatric ROS (+)  Hematology negative hematology ROS (+)   Anesthesia Other Findings   Reproductive/Obstetrics                         Anesthesia Physical Anesthesia Plan  ASA: II  Anesthesia Plan: Spinal   Post-op Pain Management:    Induction: Intravenous  Airway Management Planned: Simple Face Mask  Additional Equipment:   Intra-op Plan:   Post-operative Plan:   Informed Consent: I have reviewed the patients History and Physical, chart, labs and discussed the procedure including the risks, benefits and alternatives for the proposed anesthesia with the patient or authorized representative who has indicated his/her understanding and acceptance.   Dental advisory given  Plan Discussed with: CRNA  Anesthesia Plan Comments:         Anesthesia Quick Evaluation

## 2013-10-06 NOTE — Progress Notes (Signed)
[facility]   [facility] is providing the following services: RW (patient declined commode)  If patient discharges after hours, please call [phone].   Linward Headland 10/06/2013, 12:04 PM

## 2013-10-06 NOTE — Interval H&P Note (Signed)
History and Physical Interval Note:  10/06/2013 7:25 AM  Carolyn [name]  has presented today for surgery, with the diagnosis of RIGHT KNEE OA MEDIALLY   The various methods of treatment have been discussed with the patient and family. After consideration of risks, benefits and other options for treatment, the patient has consented to  Procedure(s): RIGHT UNICOMPARTMENTAL KNEE REPLACEMENT MEDIALLY  (Right) RIGHT KNEE ARTHROSCOPY LATERAL COMPARTMENTAL DEBRIDEMENT  (Right) as a surgical intervention .  The patient's history has been reviewed, patient examined, no change in status, stable for surgery.  I have reviewed the patient's chart and labs.  Questions were answered to the patient's satisfaction.     [name]

## 2013-10-06 NOTE — Brief Op Note (Signed)
10/06/2013  9:22 AM  PATIENT:  Carolyn [name]  58 y.o. female  PRE-OPERATIVE DIAGNOSIS:  1. RIGHT KNEE osteoarthritis MEDIALLY, right knee lateral meniscal tearing  POST-OPERATIVE DIAGNOSIS:  1.  RIGHT KNEE OA MEDIALLY, 2. Right knee lateral meniscal tearing, grade II OA lateral and PF   PROCEDURE:  Procedure(s): RIGHT MEDIAL UNICOMPARTMENTAL KNEE ARTHROPLASTY (Right) RIGHT KNEE ARTHROSCOPY LATERAL COMPARTMENTAL DEBRIDEMENT, PARTIAL LATERAL MENISCECTOMY (Right)  SURGEON:  Surgeon(s) and Role:    * [name], MD - Primary  PHYSICIAN ASSISTANT: Danae Orleans, PA-C for Partial knee procedure only  ANESTHESIA:   spinal  EBL:  Total I/O In: 1000 [I.V.:1000] Out: 250 [Urine:250]  BLOOD ADMINISTERED:none  DRAINS: none   LOCAL MEDICATIONS USED:  Exparel, marcaine combination  SPECIMEN:  No Specimen  DISPOSITION OF SPECIMEN:  N/A  COUNTS:  YES  TOURNIQUET:   Total Tourniquet Time Documented: Thigh (Right) - 36 minutes Total: Thigh (Right) - 36 minutes   DICTATION: .Other Dictation: Dictation Number [id number], for knee arthroscopy portion of case  PLAN OF CARE: Admit for overnight observation  PATIENT DISPOSITION:  PACU - hemodynamically stable.   Delay start of Pharmacological VTE agent (>24hrs) due to surgical blood loss or risk of bleeding: no

## 2013-10-06 NOTE — Anesthesia Postprocedure Evaluation (Signed)
Anesthesia Post Note  Patient: Carolyn [name]  Procedure(s) Performed: Procedure(s) (LRB): RIGHT MEDIAL UNICOMPARTMENTAL KNEE ARTHROPLASTY (Right) RIGHT KNEE ARTHROSCOPY LATERAL COMPARTMENTAL DEBRIDEMENT, PARTIAL LATERAL MENISCECTOMY (Right)  Anesthesia type: General  Patient location: PACU  Post pain: Pain level controlled  Post assessment: Post-op Vital signs reviewed  Last Vitals:  Filed Vitals:   10/06/13 1145  BP: 114/72  Pulse: 86  Temp: 36.4 C  Resp: 16    Post vital signs: Reviewed  Level of consciousness: sedated  Complications: No apparent anesthesia complications

## 2013-10-06 NOTE — Anesthesia Procedure Notes (Signed)
Spinal  Patient location during procedure: OR Start time: 10/06/2013 7:39 AM End time: 10/06/2013 7:45 AM Staffing Anesthesiologist: Freddie Apley F Performed by: anesthesiologist  Preanesthetic Checklist Completed: patient identified, site marked, surgical consent, pre-op evaluation, timeout performed, IV checked, risks and benefits discussed and monitors and equipment checked Spinal Block Patient position: sitting Prep: Betadine Patient monitoring: heart rate, continuous pulse ox and blood pressure Approach: midline Location: L3-4 Injection technique: single-shot Needle Needle type: Spinocan  Needle gauge: 25 G Needle length: 9 cm Additional Notes Expiration date of kit checked and confirmed. Patient tolerated procedure well, without complications. Negative heme/paresthesia

## 2013-10-06 NOTE — Progress Notes (Signed)
10/06/13 1557  PT Time Calculation  PT Start Time 1512  PT Stop Time 1535  PT Time Calculation (min) 23 min  PT G-Codes **NOT FOR INPATIENT CLASS**  Functional Assessment Tool Used (clinical judgement)  Functional Limitation Mobility: Walking and moving around  Mobility: Walking and Moving Around Current Status (J2426) CJ  Mobility: Walking and Moving Around Goal Status (S3419) CI  PT General Charges  $$ ACUTE PT VISIT 1 Procedure  PT Evaluation  $Initial PT Evaluation Tier I 1 Procedure  PT Treatments  $Gait Training 8-22 mins  $Therapeutic Activity 8-22 mins   Weston Anna, MPT [phone]

## 2013-10-06 NOTE — Evaluation (Signed)
Physical Therapy Evaluation Patient Details Name: Carolyn [name] MRN: [id number] DOB: [birth year] Today's Date: 10/06/2013   History of Present Illness  58 yo female s/p R UKR, partial meniscectomy. Hx of HTN, CAD, chest pain, fibromyalgia, carpal tunnel syndrome.   Clinical Impression  On eval, pt required Min assist for mobility-able to ambulate ~40 feet with RW on POD 0. Tolerated fairly well. Pt reports R knee feeling as if it wants to give way/buckle. Anticipate pt will progress well during stay.     Follow Up Recommendations Home health PT    Equipment Recommendations  Rolling walker with 5" wheels    Recommendations for Other Services       Precautions / Restrictions Precautions Precautions: Fall;Knee Restrictions Weight Bearing Restrictions: No RLE Weight Bearing: Weight bearing as tolerated      Mobility  Bed Mobility Overal bed mobility: Needs Assistance Bed Mobility: Supine to Sit;Sit to Supine     Supine to sit: Min assist Sit to supine: Min assist   General bed mobility comments: assist for R LE.   Transfers Overall transfer level: Needs assistance Equipment used: Rolling walker (2 wheeled) Transfers: Sit to/from Stand Sit to Stand: Min assist;From elevated surface         General transfer comment: Assist to rise, stabilize, control descent. VCs safety, technique, hand placement  Ambulation/Gait Ambulation/Gait assistance: Min assist Ambulation Distance (Feet): 40 Feet Assistive device: Rolling walker (2 wheeled) Gait Pattern/deviations: Step-to pattern;Antalgic;Decreased stride length;Decreased step length - right     General Gait Details: assist to stabilize intermittently. R LE giving way slightly per pt. VCs safety, technique, sequence.   Stairs            Wheelchair Mobility    Modified Rankin (Stroke Patients Only)       Balance                                             Pertinent Vitals/Pain 5/10 R  knee with activity. Ice applied end of session    [place] expects to be discharged to:: Private residence Living Arrangements: Spouse/significant other Available Help at Discharge: Family Type of Home: House Home Access: Stairs to enter Entrance Stairs-Rails: Psychiatric nurse of Steps: [place]: One level Home Equipment: Environmental consultant - 2 wheels      Prior Function Level of Independence: Independent               Hand Dominance        Extremity/Trunk Assessment   Upper Extremity Assessment: Overall WFL for tasks assessed           Lower Extremity Assessment: RLE deficits/detail RLE Deficits / Details: hip flex 3/5, moves ankle well.     Cervical / Trunk Assessment: Normal  Communication   Communication: No difficulties  Cognition Arousal/Alertness: Awake/alert Behavior During Therapy: WFL for tasks assessed/performed Overall Cognitive Status: Within Functional Limits for tasks assessed                      General Comments      Exercises        Assessment/Plan    PT Assessment Patient needs continued PT services  PT Diagnosis Difficulty walking;Abnormality of gait;Acute pain   PT Problem List Decreased range of motion;Decreased activity tolerance;Decreased balance;Decreased mobility;Pain;Decreased knowledge of use of DME  PT Treatment Interventions DME  instruction;Gait training;Stair training;Functional mobility training;Therapeutic activities;Therapeutic exercise;Patient/family education;Balance training   PT Goals (Current goals can be found in the Care Plan section) Acute Rehab PT Goals Patient Stated Goal: regain independence PT Goal Formulation: With patient Time For Goal Achievement: 10/13/13 Potential to Achieve Goals: Good    Frequency 7X/week   Barriers to discharge        Co-evaluation               End of Session Equipment Utilized During Treatment: Gait belt Activity Tolerance:  Patient tolerated treatment well Patient left: in bed;with call bell/phone within reach;with family/visitor present           Time: 5631-4970 PT Time Calculation (min): 23 min   Charges:   PT Evaluation $Initial PT Evaluation Tier I: 1 Procedure PT Treatments $Gait Training: 8-22 mins $Therapeutic Activity: 8-22 mins   PT G Codes:          Weston Anna, MPT Pager: [phone]

## 2013-10-07 ENCOUNTER — Encounter (HOSPITAL_COMMUNITY): Payer: Self-pay | Admitting: Orthopedic Surgery

## 2013-10-07 LAB — CBC
HCT: 32.9 % — ABNORMAL LOW (ref 36.0–46.0)
HEMOGLOBIN: 11.4 g/dL — AB (ref 12.0–15.0)
MCH: 30.6 pg (ref 26.0–34.0)
MCHC: 34.7 g/dL (ref 30.0–36.0)
MCV: 88.2 fL (ref 78.0–100.0)
Platelets: 226 10*3/uL (ref 150–400)
RBC: 3.73 MIL/uL — ABNORMAL LOW (ref 3.87–5.11)
RDW: 12.3 % (ref 11.5–15.5)
WBC: 16.9 10*3/uL — ABNORMAL HIGH (ref 4.0–10.5)

## 2013-10-07 LAB — BASIC METABOLIC PANEL
Anion gap: 9 (ref 5–15)
BUN: 14 mg/dL (ref 6–23)
CO2: 27 mEq/L (ref 19–32)
Calcium: 8.8 mg/dL (ref 8.4–10.5)
Chloride: 103 mEq/L (ref 96–112)
Creatinine, Ser: 0.63 mg/dL (ref 0.50–1.10)
GFR calc Af Amer: >90 mL/min (ref >90)
GFR calc non Af Amer: >90 mL/min (ref >90)
GLUCOSE: 153 mg/dL — AB (ref 70–99)
POTASSIUM: 3.8 meq/L (ref 3.7–5.3)
SODIUM: 139 meq/L (ref 137–147)

## 2013-10-07 MED ORDER — METHOCARBAMOL 500 MG PO TABS: 500.0000 mg | ORAL_TABLET | Freq: Four times a day (QID) | ORAL | Status: DC | PRN

## 2013-10-07 MED ORDER — DSS 100 MG PO CAPS: 100.0000 mg | ORAL_CAPSULE | Freq: Two times a day (BID) | ORAL | Status: DC

## 2013-10-07 MED ORDER — ACETAMINOPHEN 325 MG PO TABS: 650.0000 mg | ORAL_TABLET | Freq: Four times a day (QID) | ORAL | Status: DC | PRN

## 2013-10-07 MED ORDER — FERROUS SULFATE 325 (65 FE) MG PO TABS: 325.0000 mg | ORAL_TABLET | Freq: Three times a day (TID) | ORAL | Status: DC

## 2013-10-07 MED ORDER — POLYETHYLENE GLYCOL 3350 17 G PO PACK: 17.0000 g | PACK | Freq: Two times a day (BID) | ORAL | Status: DC

## 2013-10-07 MED ORDER — ASPIRIN 325 MG PO TBEC: 325.0000 mg | DELAYED_RELEASE_TABLET | Freq: Two times a day (BID) | ORAL | Status: AC

## 2013-10-07 MED ORDER — TRAMADOL HCL 50 MG PO TABS: 50.0000 mg | ORAL_TABLET | Freq: Four times a day (QID) | ORAL | Status: DC

## 2013-10-07 MED ORDER — CELECOXIB 200 MG PO CAPS: 200.0000 mg | ORAL_CAPSULE | Freq: Two times a day (BID) | ORAL | Status: DC

## 2013-10-07 NOTE — Evaluation (Signed)
Occupational Therapy Evaluation Patient Details Name: Carolyn [name] MRN: [id number] DOB: Apr 16, 1955 Today's Date: 10/07/2013    History of Present Illness 58 yo female s/p R UKR, partial meniscectomy. Hx of HTN, CAD, chest pain, fibromyalgia, carpal tunnel syndrome.    Clinical Impression   Patient presents during OT evaluation as alert and moving well about her room with her rolling walker.  Patient able to complete basic ADL's with modified independence after brief review of shower transfer technique.  Patient will not require any further OT services at this time.  Thanks for this referral.    Follow Up Recommendations  No OT follow up    Equipment Recommendations  None recommended by OT    Recommendations for Other Services       Precautions / Restrictions Precautions Precautions: Fall;Knee Restrictions Weight Bearing Restrictions: No RLE Weight Bearing: Weight bearing as tolerated      Mobility Bed Mobility Overal bed mobility: Independent Bed Mobility: Supine to Sit     Supine to sit: Independent Sit to supine: Independent      Transfers Overall transfer level: Modified independent Equipment used: Rolling walker (2 wheeled) Transfers: Sit to/from Stand Sit to Stand: Modified independent (Device/Increase time)              Balance                                            ADL Overall ADL's : Modified independent                                       General ADL Comments: using walker for mobility     Vision                     Perception Perception Perception Tested?: No   Praxis Praxis Praxis tested?: Not tested    Pertinent Vitals/Pain 3/10 right knee     Hand Dominance Right   Extremity/Trunk Assessment Upper Extremity Assessment Upper Extremity Assessment: Overall WFL for tasks assessed   Lower Extremity Assessment Lower Extremity Assessment: Defer to PT evaluation   Cervical /  Trunk Assessment Cervical / Trunk Assessment: Normal   Communication Communication Communication: No difficulties   Cognition Arousal/Alertness: Awake/alert Behavior During Therapy: WFL for tasks assessed/performed Overall Cognitive Status: Within Functional Limits for tasks assessed                     General Comments       Exercises       Shoulder Instructions      Home Living Family/patient expects to be discharged to:: Private residence Living Arrangements: Spouse/significant other Available Help at Discharge: Family Type of Home: House Home Access: Stairs to enter Technical brewer of Steps: 6 Entrance Stairs-Rails: Right;Left Home Layout: One level     Bathroom Shower/Tub: Tub/shower unit;Walk-in shower (plans to use walk in shower with seat) Shower/tub characteristics: Curtain       Home Equipment: Environmental consultant - 2 wheels;Bedside commode;Shower seat (MD ordered [facility], Patient borrowed tub seat)          Prior Functioning/Environment Level of Independence: Independent             OT Diagnosis:     OT Problem List:     OT  Treatment/Interventions:      OT Goals(Current goals can be found in the care plan section) Acute Rehab OT Goals Patient Stated Goal: regain independence OT Goal Formulation: With patient Potential to Achieve Goals: Good  OT Frequency:     Barriers to D/C:            Co-evaluation              End of Session Equipment Utilized During Treatment: Rolling walker  Activity Tolerance: Patient tolerated treatment well Patient left: in bed;with call bell/phone within reach   Time: 0840-0902 OT Time Calculation (min): 22 min Charges:  OT General Charges $OT Visit: 1 Procedure OT Evaluation $Initial OT Evaluation Tier I: 1 Procedure OT Treatments $Self Care/Home Management : 8-22 mins G-Codes: OT G-codes **NOT FOR INPATIENT CLASS** Functional Assessment Tool Used: skilled clinical judgement Functional  Limitation: Self care Self Care Current Status (V9166): At least 1 percent but less than 20 percent impaired, limited or restricted Self Care Goal Status (M6004): At least 1 percent but less than 20 percent impaired, limited or restricted Self Care Discharge Status [phone]): At least 1 percent but less than 20 percent impaired, limited or restricted  Mariah Milling 10/07/2013, 9:03 AM

## 2013-10-07 NOTE — Progress Notes (Signed)
Patient ID: Carolyn [name], female   DOB: [birth year], 58 y.o.   MRN: [id number] Subjective: 1 Day Post-Op Procedure(s) (LRB): RIGHT MEDIAL UNICOMPARTMENTAL KNEE ARTHROPLASTY (Right) RIGHT KNEE ARTHROSCOPY LATERAL COMPARTMENTAL DEBRIDEMENT, PARTIAL LATERAL MENISCECTOMY (Right)    Patient reports pain as mild.  Did well over night. Resolution of perineal numbness.  Foley out and already voided this am  Objective:   VITALS:   Filed Vitals:   10/07/13 0701  BP:   Pulse:   Temp:   Resp: 18    Neurovascular intact Incision: dressing C/D/I  LABS  Recent Labs  10/07/13 0415  HGB 11.4*  HCT 32.9*  WBC 16.9*  PLT 226     Recent Labs  10/07/13 0415  NA 139  K 3.8  BUN 14  CREATININE 0.63  GLUCOSE 153*    No results found for this basename: LABPT, INR,  in the last 72 hours   Assessment/Plan: 1 Day Post-Op Procedure(s) (LRB): RIGHT MEDIAL UNICOMPARTMENTAL KNEE ARTHROPLASTY (Right) RIGHT KNEE ARTHROSCOPY LATERAL COMPARTMENTAL DEBRIDEMENT, PARTIAL LATERAL MENISCECTOMY (Right)   Up with therapy Discharge home with home health this am  Reviewed instructions and goals RTC in 2 weeks

## 2013-10-07 NOTE — Discharge Summary (Signed)
Physician Discharge Summary  Patient ID: Carolyn [name] MRN: [id number] DOB/AGE: 1955/06/28 58 y.o.  Admit date: 10/06/2013 Discharge date: 10/07/2013   Procedures:  Procedure(s) (LRB): RIGHT MEDIAL UNICOMPARTMENTAL KNEE ARTHROPLASTY (Right) RIGHT KNEE ARTHROSCOPY LATERAL COMPARTMENTAL DEBRIDEMENT, PARTIAL LATERAL MENISCECTOMY (Right)  Attending Physician:  Dr. Paralee Cancel   Admission Diagnoses:   Right knee OA / pain.  Discharge Diagnoses:  Principal Problem:   S/P right UKR Active Problems:   S/P arthroscopy of right knee  Past Medical History  Diagnosis Date  . Esophageal stricture   . Depression   . Abnormal LFTs   . Gastric polyp   . GERD (gastroesophageal reflux disease)   . Hypertension   . Hyperlipidemia   . PONV (postoperative nausea and vomiting)     versed and dilaudid at surgery center  . Episode of syncope 4 years ago  . H/O bronchitis   . Arthritis   . Osteopenia     hips  . Esophageal spasm   . Anxiety     HPI: Carolyn [name], 58 y.o. female, has a history of pain and functional disability in the right knee due to arthritis and has failed non-surgical conservative treatments for greater than 12 weeks to includeNSAID's and/or analgesics, corticosteriod injections and activity modification. Onset of symptoms was gradual, starting 2 years ago with gradually worsening course since that time. The patient noted prior procedures on the knee to include menisectomy on the right knee(s). Patient currently rates pain in the right knee(s) at 7 out of 10 with activity. Patient has night pain, worsening of pain with activity and weight bearing, pain that interferes with activities of daily living, pain with passive range of motion, crepitus and joint swelling. Patient has evidence of periarticular osteophytes and joint space narrowing of the medial compartment by imaging studies. There is no active infection. Risks, benefits and expectations were discussed with the  patient. Risks including but not limited to the risk of anesthesia, blood clots, nerve damage, blood vessel damage, failure of the prosthesis, infection and up to and including death. Patient understand the risks, benefits and expectations and wishes to proceed with surgery.   PCP: [name], MD   Discharged Condition: good  Hospital Course:  Patient underwent the above stated procedure on 10/06/2013. Patient tolerated the procedure well and brought to the recovery room in good condition and subsequently to the floor.  POD #1 BP: 119/74 ; Pulse: 90 ; Temp: 97.5 F (36.4 C) ; Resp: 18 Patient reports pain as mild. Did well over night. Resolution of perineal numbness. Foley out and already voided this am. Ready to be discharged home. Neurovascular intact, dorsiflexion/plantar flexion intact, incision: dressing C/D/I, no cellulitis present and compartment soft.   LABS  Basename    HGB  11.4  HCT  32.9    Discharge Exam: General appearance: alert, cooperative and no distress Extremities: Homans sign is negative, no sign of DVT, no edema, redness or tenderness in the calves or thighs and no ulcers, gangrene or trophic changes  Disposition: Home with follow up in 2 weeks   Follow-up Information   Follow up with [name], MD. Schedule an appointment as soon as possible for a visit in 2 weeks.   Specialty:  Orthopedic Surgery   Contact information:   [address] [place] [address] [phone]       Discharge Instructions   Call MD / Call 911    Complete by:  As directed  If you experience chest pain or shortness of breath, CALL 911 and be transported to the hospital emergency room.  If you develope a fever above 101 F, pus (white drainage) or increased drainage or redness at the wound, or calf pain, call your surgeon's office.     Change dressing    Complete by:  As directed   Maintain surgical dressing for 10-14 days, or until follow up in the clinic.       Constipation Prevention    Complete by:  As directed   Drink plenty of fluids.  Prune juice may be helpful.  You may use a stool softener, such as Colace (over the counter) 100 mg twice a day.  Use MiraLax (over the counter) for constipation as needed.     Diet - low sodium heart healthy    Complete by:  As directed      Discharge instructions    Complete by:  As directed   Maintain surgical dressing for 10-14 days, or until follow up in the clinic. Follow up in 2 weeks at [facility]. Call with any questions or concerns.     Driving restrictions    Complete by:  As directed   No driving for 4 weeks     Increase activity slowly as tolerated    Complete by:  As directed      TED hose    Complete by:  As directed   Use stockings (TED hose) for 2 weeks on both leg(s).  You may remove them at night for sleeping.     Weight bearing as tolerated    Complete by:  As directed              Medication List    STOP taking these medications       meloxicam 7.5 MG tablet  Commonly known as:  MOBIC      TAKE these medications       acetaminophen 325 MG tablet  Commonly known as:  TYLENOL  Take 2 tablets (650 mg total) by mouth every 6 (six) hours as needed for mild pain (or Fever >/= 101).     ALPRAZolam 0.5 MG tablet  Commonly known as:  XANAX  Take 1 tablet by mouth once daily for esophageal spasms     aspirin 325 MG EC tablet  Take 1 tablet (325 mg total) by mouth 2 (two) times daily.     celecoxib 200 MG capsule  Commonly known as:  CELEBREX  Take 1 capsule (200 mg total) by mouth every 12 (twelve) hours.     DSS 100 MG Caps  Take 100 mg by mouth 2 (two) times daily.     ferrous sulfate 325 (65 FE) MG tablet  Take 1 tablet (325 mg total) by mouth 3 (three) times daily after meals.     lansoprazole 15 MG capsule  Commonly known as:  PREVACID  Take 15 mg by mouth daily at 12 noon.     methocarbamol 500 MG tablet  Commonly known as:  ROBAXIN  Take 1  tablet (500 mg total) by mouth every 6 (six) hours as needed for muscle spasms.     metoprolol tartrate 25 MG tablet  Commonly known as:  LOPRESSOR  Take 12.5 mg by mouth 2 (two) times daily.     polyethylene glycol packet  Commonly known as:  MIRALAX / GLYCOLAX  Take 17 g by mouth 2 (two) times daily.     traMADol 50 MG tablet  Commonly known as:  ULTRAM  Take 1-2 tablets (50-100 mg total) by mouth every 6 (six) hours.     triamterene-hydrochlorothiazide 37.5-25 MG per capsule  Commonly known as:  DYAZIDE  Take 1 capsule by mouth every morning.         Signed: [place] Pugh. Arabella Revelle   PA-C  10/07/2013, 12:37 PM

## 2013-10-07 NOTE — Progress Notes (Signed)
Pt to d/c home with [facility]. RW delivered to room before d/c. AVS reviewed and "My Chart" discussed with pt. Pt capable of verbalizing medications, signs and symptoms of infection, and follow-up appointments. Remains hemodynamically stable. No signs and symptoms of distress. Educated pt to return to ER in the case of SOB, dizziness, or chest pain.

## 2013-10-07 NOTE — Progress Notes (Signed)
Physical Therapy Treatment Patient Details Name: ALYZZA ANDRINGA MRN: [id number] DOB: 1955-09-07 Today's Date: 10/07/2013    History of Present Illness 58 yo female s/p R UKR, partial meniscectomy. Hx of HTN, CAD, chest pain, fibromyalgia, carpal tunnel syndrome.     PT Comments    Progressing well with mobility. Practiced ambulation, exercises, and stair negotiation. All education completed. Ready to d/c home from PT standpoint.   Follow Up Recommendations  Home health PT     Equipment Recommendations  Rolling walker with 5" wheels    Recommendations for Other Services       Precautions / Restrictions Precautions Precautions: Knee;Fall Restrictions Weight Bearing Restrictions: No RLE Weight Bearing: Weight bearing as tolerated    Mobility  Bed Mobility Overal bed mobility: Needs Assistance Bed Mobility: Supine to Sit;Sit to Supine     Supine to sit: Modified independent (Device/Increase time) Sit to supine: Modified independent (Device/Increase time)      Transfers Overall transfer level: Needs assistance Equipment used: Rolling walker (2 wheeled) Transfers: Sit to/from Stand Sit to Stand: Supervision         General transfer comment: VCs safety, technique, hand placement  Ambulation/Gait Ambulation/Gait assistance: Supervision Ambulation Distance (Feet): 150 Feet Assistive device: Rolling walker (2 wheeled) Gait Pattern/deviations: Antalgic;Decreased stance time - right;Decreased stride length;Step-to pattern     General Gait Details: VCs safety.    Stairs Stairs: Yes Stairs assistance: Min assist Stair Management: Forwards;One rail Left Number of Stairs: 4 General stair comments: VCs safety, technique, sequence. Pt used 1 handrail on 1 side, therapist's hand on other side.   Wheelchair Mobility    Modified Rankin (Stroke Patients Only)       Balance                                    Cognition Arousal/Alertness:  Awake/alert Behavior During Therapy: WFL for tasks assessed/performed Overall Cognitive Status: Within Functional Limits for tasks assessed                      Exercises Total Joint Exercises Ankle Circles/Pumps: AROM;Both;10 reps;Supine Quad Sets: AROM;Both;10 reps;Supine Heel Slides: AAROM;Right;10 reps;Supine Hip ABduction/ADduction: AAROM;Right;Supine Straight Leg Raises: AAROM;Right;10 reps;Supine Goniometric ROM: 5-40 degrees    General Comments        Pertinent Vitals/Pain 4/10 r knee with activity. Ice applied end of session    [place] expects to be discharged to:: Private residence Living Arrangements: Spouse/significant other Available Help at Discharge: Family Type of Home: House Home Access: Stairs to enter Entrance Stairs-Rails: [place]: One [place]: Environmental consultant - 2 wheels;Bedside commode;Shower seat (MD ordered [facility], Patient borrowed tub seat)      Prior Function Level of Independence: Independent          PT Goals (current goals can now be found in the care plan section) Acute Rehab PT Goals Patient Stated Goal: regain independence Progress towards PT goals: Progressing toward goals    Frequency  7X/week    PT Plan Current plan remains appropriate    Co-evaluation             End of Session Equipment Utilized During Treatment: Gait belt Activity Tolerance: Patient tolerated treatment well Patient left: in chair;with call bell/phone within reach;with family/visitor present     Time: 5035-4656 PT Time Calculation (min): 29 min  Charges:  $Gait Training: 8-22 mins $Therapeutic Exercise: 8-22  mins                    G Codes:  Functional Assessment Tool Used: clinical judgement Functional Limitation: Mobility: Walking and moving around Mobility: Walking and Moving Around Current Status [phone]): At least 1 percent but less than 20 percent impaired, limited or restricted Mobility: Walking  and Moving Around Goal Status [phone]): At least 1 percent but less than 20 percent impaired, limited or restricted Mobility: Walking and Moving Around Discharge Status [phone]): At least 1 percent but less than 20 percent impaired, limited or restricted   Weston Anna, MPT Pager: [phone]

## 2013-11-30 ENCOUNTER — Encounter (HOSPITAL_COMMUNITY): Payer: Self-pay | Admitting: Emergency Medicine

## 2013-11-30 ENCOUNTER — Emergency Department (HOSPITAL_COMMUNITY): Payer: PRIVATE HEALTH INSURANCE

## 2013-11-30 ENCOUNTER — Emergency Department (HOSPITAL_COMMUNITY)
Admission: EM | Admit: 2013-11-30 | Discharge: 2013-11-30 | Disposition: A | Discharge status: Home / Self Care | Payer: PRIVATE HEALTH INSURANCE | Attending: Emergency Medicine | Admitting: Emergency Medicine

## 2013-11-30 DIAGNOSIS — Z791 Long term (current) use of non-steroidal anti-inflammatories (NSAID): Secondary | ICD-10-CM | POA: Diagnosis not present

## 2013-11-30 DIAGNOSIS — F3289 Other specified depressive episodes: Secondary | ICD-10-CM | POA: Diagnosis not present

## 2013-11-30 DIAGNOSIS — K219 Gastro-esophageal reflux disease without esophagitis: Secondary | ICD-10-CM | POA: Diagnosis not present

## 2013-11-30 DIAGNOSIS — I10 Essential (primary) hypertension: Secondary | ICD-10-CM | POA: Insufficient documentation

## 2013-11-30 DIAGNOSIS — Z8709 Personal history of other diseases of the respiratory system: Secondary | ICD-10-CM | POA: Insufficient documentation

## 2013-11-30 DIAGNOSIS — M129 Arthropathy, unspecified: Secondary | ICD-10-CM | POA: Diagnosis not present

## 2013-11-30 DIAGNOSIS — Z862 Personal history of diseases of the blood and blood-forming organs and certain disorders involving the immune mechanism: Secondary | ICD-10-CM | POA: Insufficient documentation

## 2013-11-30 DIAGNOSIS — S0990XA Unspecified injury of head, initial encounter: Secondary | ICD-10-CM | POA: Diagnosis not present

## 2013-11-30 DIAGNOSIS — Z79899 Other long term (current) drug therapy: Secondary | ICD-10-CM | POA: Insufficient documentation

## 2013-11-30 DIAGNOSIS — F411 Generalized anxiety disorder: Secondary | ICD-10-CM | POA: Insufficient documentation

## 2013-11-30 DIAGNOSIS — Z8739 Personal history of other diseases of the musculoskeletal system and connective tissue: Secondary | ICD-10-CM | POA: Insufficient documentation

## 2013-11-30 DIAGNOSIS — F329 Major depressive disorder, single episode, unspecified: Secondary | ICD-10-CM | POA: Insufficient documentation

## 2013-11-30 DIAGNOSIS — Y92410 Unspecified street and highway as the place of occurrence of the external cause: Secondary | ICD-10-CM | POA: Diagnosis not present

## 2013-11-30 DIAGNOSIS — S298XXA Other specified injuries of thorax, initial encounter: Secondary | ICD-10-CM | POA: Insufficient documentation

## 2013-11-30 DIAGNOSIS — Y9389 Activity, other specified: Secondary | ICD-10-CM | POA: Insufficient documentation

## 2013-11-30 DIAGNOSIS — S01501A Unspecified open wound of lip, initial encounter: Secondary | ICD-10-CM | POA: Insufficient documentation

## 2013-11-30 DIAGNOSIS — R0789 Other chest pain: Secondary | ICD-10-CM

## 2013-11-30 DIAGNOSIS — Z8639 Personal history of other endocrine, nutritional and metabolic disease: Secondary | ICD-10-CM | POA: Insufficient documentation

## 2013-11-30 MED ORDER — METHOCARBAMOL 500 MG PO TABS: 500.0000 mg | ORAL_TABLET | Freq: Two times a day (BID) | ORAL | Status: DC

## 2013-11-30 MED ORDER — IBUPROFEN 800 MG PO TABS: 800.0000 mg | ORAL_TABLET | Freq: Three times a day (TID) | ORAL | Status: DC

## 2013-11-30 MED ORDER — IBUPROFEN 800 MG PO TABS
800.0000 mg | ORAL_TABLET | Freq: Once | ORAL | Status: AC
  Administered 2013-11-30: 800 mg via ORAL
  Filled 2013-11-30: qty 1

## 2013-11-30 NOTE — ED Notes (Signed)
Pt escorted to discharge window. Verbalized understanding discharge instructions. In no acute distress.   
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
## 2013-11-30 NOTE — ED Provider Notes (Signed)
Medical screening examination/treatment/procedure(s) were performed by non-physician practitioner and as supervising physician I was immediately available for consultation/collaboration.   Leota Jacobsen, MD 11/30/13 1537

## 2013-12-15 ENCOUNTER — Telehealth: Payer: Self-pay | Admitting: Internal Medicine

## 2013-12-15 NOTE — Telephone Encounter (Signed)
Spoke with patient and she states since she had a knee replacement back in July, she has been losing weight. States over the weekend, she started having burning in lower stomach after eating. Reports cramping also. States she has diarrhea only in the morning. Reports she had been eating regular until the weekend and was still losing weight. Offered OV with extender this week but she wants to see Dr.Brodie. Scheduled with Dr. Olevia Perches on 12/23/13 at 9:00 AM. Patient understands to call back or see PCP if symptoms worsen before OV.

## 2013-12-23 ENCOUNTER — Other Ambulatory Visit (INDEPENDENT_AMBULATORY_CARE_PROVIDER_SITE_OTHER): Payer: PRIVATE HEALTH INSURANCE

## 2013-12-23 ENCOUNTER — Encounter: Payer: Self-pay | Admitting: Internal Medicine

## 2013-12-23 ENCOUNTER — Ambulatory Visit (INDEPENDENT_AMBULATORY_CARE_PROVIDER_SITE_OTHER): Payer: PRIVATE HEALTH INSURANCE | Admitting: Internal Medicine

## 2013-12-23 VITALS — BP 122/64 | HR 68 | Ht 62.75 in | Wt 138.2 lb

## 2013-12-23 DIAGNOSIS — R634 Abnormal weight loss: Secondary | ICD-10-CM

## 2013-12-23 DIAGNOSIS — R109 Unspecified abdominal pain: Secondary | ICD-10-CM

## 2013-12-23 LAB — CBC WITH DIFFERENTIAL/PLATELET
BASOS ABS: 0 10*3/uL (ref 0.0–0.1)
Basophils Relative: 0.3 % (ref 0.0–3.0)
Eosinophils Absolute: 0 10*3/uL (ref 0.0–0.7)
Eosinophils Relative: 0.6 % (ref 0.0–5.0)
HCT: 38.2 % (ref 36.0–46.0)
Hemoglobin: 13.2 g/dL (ref 12.0–15.0)
LYMPHS ABS: 1.8 10*3/uL (ref 0.7–4.0)
Lymphocytes Relative: 26.4 % (ref 12.0–46.0)
MCHC: 34.5 g/dL (ref 30.0–36.0)
MCV: 87.1 fl (ref 78.0–100.0)
MONO ABS: 0.6 10*3/uL (ref 0.1–1.0)
MONOS PCT: 8.9 % (ref 3.0–12.0)
Neutro Abs: 4.4 10*3/uL (ref 1.4–7.7)
Neutrophils Relative %: 63.8 % (ref 43.0–77.0)
PLATELETS: 296 10*3/uL (ref 150.0–400.0)
RBC: 4.38 Mil/uL (ref 3.87–5.11)
RDW: 12.2 % (ref 11.5–15.5)
WBC: 6.8 10*3/uL (ref 4.0–10.5)

## 2013-12-23 LAB — SEDIMENTATION RATE: SED RATE: 43 mm/h — AB (ref 0–22)

## 2013-12-23 MED ORDER — MOVIPREP 100 G PO SOLR: 1.0000 | Freq: Once | ORAL | Status: DC

## 2013-12-23 MED ORDER — SUCRALFATE 1 GM/10ML PO SUSP: 1.0000 g | Freq: Two times a day (BID) | ORAL | Status: DC

## 2013-12-23 NOTE — Patient Instructions (Addendum)
You have been scheduled for an endoscopy and colonoscopy. Please follow the written instructions given to you at your visit today. Please pick up your prep at the pharmacy within the next 1-3 days. If you use inhalers (even only as needed), please bring them with you on the day of your procedure. Your physician has requested that you go to www.startemmi.com and enter the access code given to you at your visit today. This web site gives a general overview about your procedure. However, you should still follow specific instructions given to you by our office regarding your preparation for the procedure.  Your physician has requested that you go to the basement for the following lab work before leaving today: CBC, IBC, CMET, TSH, SED  We have sent the following medications to your pharmacy for you to pick up at your convenience: Carafate  GB:EEFEO Kim

## 2013-12-23 NOTE — Progress Notes (Signed)
Carolyn [name] 03/05/1956 [id number]  Note: This dictation was prepared with Dragon digital system. Any transcriptional errors that result from this procedure are unintentional.   History of Present Illness:  This is a 58 year old, white female who I have been following for esophageal spasms for 20 years. She now has developed lower abdominal pain shortly after having a right knee replacement on 10/06/2013. She started on Mobic 15 mg twice a day right after the surgery and continued for 9 weeks as well as on aspirin. She is  Having abdominal  pain within 5 minutes of eating. It does not bother her at night. She has lost 20 pounds in the last 2 months, from an initial 159 pounds to 138 pounds today. There has been no nausea or vomiting but she is having some epigastric discomfort. Most of her pain however is located in the lower abdomen. She denies being constipated and denies any visible blood per rectum. Her last colonoscopy in November 2005 was normal. She had numerous upper endoscopies for evaluation of esophageal spasm and dysphagia starting in 1990, 1994, 1995, 1999, 2003, 2005 and last time in November 2010. She has a tortuous esophagus which was dilated with 54 [place] Maloney dilator. She has had a prior cholecystectomy. She is currently on Prevacid 30 mg twice a day and TUMS. There is no family history of colon cancer.    Past Medical History  Diagnosis Date  . Esophageal stricture   . Depression   . Abnormal LFTs   . Gastric polyp   . GERD (gastroesophageal reflux disease)   . Hypertension   . Hyperlipidemia   . PONV (postoperative nausea and vomiting)     versed and dilaudid at surgery center  . Episode of syncope 4 years ago  . H/O bronchitis   . Arthritis   . Osteopenia     hips  . Esophageal spasm   . Anxiety     Past Surgical History  Procedure Laterality Date  . Carpal tunnel release Bilateral 1980's  . Knee surgery  40 years ago    right  . Elbow surgery Right  1999  . Cholecystectomy  1997  . Esophageal dilation    . Abdominal hysterectomy  age 44  . Fracture surgery Left 2006    wrist  . Partial knee arthroplasty Right 10/06/2013    Procedure: RIGHT MEDIAL UNICOMPARTMENTAL KNEE ARTHROPLASTY;  Surgeon: [name], MD;  Location: [facility];  Service: Orthopedics;  Laterality: Right;  . Knee arthroscopy Right 10/06/2013    Procedure: RIGHT KNEE ARTHROSCOPY LATERAL COMPARTMENTAL DEBRIDEMENT, PARTIAL LATERAL MENISCECTOMY;  Surgeon: [name], MD;  Location: [facility];  Service: Orthopedics;  Laterality: Right;    Allergies  Allergen Reactions  . Hydrocodone-Acetaminophen Nausea And Vomiting  . Other     Anesthesia makes her vomit  . Oxycodone Nausea And Vomiting    Family history and social history have been reviewed.  Review of Systems: Occasional dysphagia, occasional chest pain. Low abdominal pain  The remainder of the 10 point ROS is negative except as outlined in the H&P  Physical Exam: General Appearance Well developed, in no distress Eyes  Non icteric  HEENT  Non traumatic, normocephalic  Mouth No lesion, tongue papillated, no cheilosis Neck Supple without adenopathy, thyroid not enlarged, no carotid bruits, no JVD Lungs Clear to auscultation bilaterally COR Normal S1, normal S2, regular rhythm, no murmur, quiet precordium Abdomen soft but diffusely tender. Mostly in the epigastrium and in the right lower quadrant.  Bowel sounds are quiet. There is no distention or tympany there is no rebound Rectal small amount of heme-negative stool Extremities  No pedal edema Skin No lesions Neurological Alert and oriented x 3 Psychological Normal mood and affect  Assessment and Plan:   Problem #37 58 year old white female with a prior history of esophageal spasm, now with lower abdominal pain in the setting of a recent total knee replacement and anti-inflammatory agents. I suspect we are dealing with a gastropathy due to anti-inflammatory  agents. We may also consider a small bowel ulcer or even colon ulcerations from the same medication. Because of the dramatic weight loss, malignancy has to be ruled out. She is Hemoccult-negative. She is due for a recall colonoscopy, last exam being 10 years ago. We will proceed with an upper endoscopy as well as colonoscopy. She will continue Prevacid 30 mg twice a day. We will be checking her CBC, iron studies, metabolic panel, sedimentation rate and TSH. I will also add Carafate slurry 10 cc twice a day.    Delfin Edis 12/23/2013

## 2013-12-24 LAB — COMPREHENSIVE METABOLIC PANEL
ALBUMIN: 4.3 g/dL (ref 3.5–5.2)
ALT: 43 U/L — ABNORMAL HIGH (ref 0–35)
AST: 42 U/L — ABNORMAL HIGH (ref 0–37)
Alkaline Phosphatase: 86 U/L (ref 39–117)
BILIRUBIN TOTAL: 0.5 mg/dL (ref 0.2–1.2)
BUN: 15 mg/dL (ref 6–23)
CO2: 28 mEq/L (ref 19–32)
CREATININE: 0.7 mg/dL (ref 0.4–1.2)
Calcium: 9.8 mg/dL (ref 8.4–10.5)
Chloride: 97 mEq/L (ref 96–112)
GFR: 85.08 mL/min (ref >60.00)
GLUCOSE: 84 mg/dL (ref 70–99)
POTASSIUM: 3.1 meq/L — AB (ref 3.5–5.1)
Sodium: 137 mEq/L (ref 135–145)
Total Protein: 7.6 g/dL (ref 6.0–8.3)

## 2013-12-24 LAB — IBC PANEL
IRON: 79 ug/dL (ref 42–145)
Saturation Ratios: 22 % (ref 20.0–50.0)
TRANSFERRIN: 256.8 mg/dL (ref 212.0–360.0)

## 2013-12-24 LAB — TSH: TSH: 0.02 u[IU]/mL — AB (ref 0.35–4.50)

## 2013-12-29 ENCOUNTER — Other Ambulatory Visit: Payer: Self-pay | Admitting: Internal Medicine

## 2013-12-29 ENCOUNTER — Ambulatory Visit (HOSPITAL_COMMUNITY)
Admission: RE | Admit: 2013-12-29 | Payer: PRIVATE HEALTH INSURANCE | Source: Ambulatory Visit | Admitting: Internal Medicine

## 2013-12-29 ENCOUNTER — Encounter (HOSPITAL_COMMUNITY): Admission: RE | Payer: Self-pay | Source: Ambulatory Visit

## 2013-12-29 DIAGNOSIS — R945 Abnormal results of liver function studies: Secondary | ICD-10-CM

## 2013-12-29 SURGERY — EGD (ESOPHAGOGASTRODUODENOSCOPY)
Anesthesia: Moderate Sedation

## 2014-01-01 ENCOUNTER — Other Ambulatory Visit (HOSPITAL_COMMUNITY): Payer: Self-pay | Admitting: Endocrinology

## 2014-01-01 DIAGNOSIS — E0590 Thyrotoxicosis, unspecified without thyrotoxic crisis or storm: Secondary | ICD-10-CM

## 2014-01-06 ENCOUNTER — Other Ambulatory Visit: Payer: PRIVATE HEALTH INSURANCE

## 2014-01-13 ENCOUNTER — Ambulatory Visit (HOSPITAL_COMMUNITY)
Admission: RE | Admit: 2014-01-13 | Discharge: 2014-01-13 | Disposition: A | Discharge status: Home / Self Care | Payer: PRIVATE HEALTH INSURANCE | Source: Ambulatory Visit | Attending: Endocrinology | Admitting: Endocrinology

## 2014-01-13 ENCOUNTER — Encounter (HOSPITAL_COMMUNITY)
Admission: RE | Admit: 2014-01-13 | Discharge: 2014-01-13 | Disposition: A | Discharge status: Home / Self Care | Payer: PRIVATE HEALTH INSURANCE | Source: Ambulatory Visit | Attending: Endocrinology | Admitting: Endocrinology

## 2014-01-13 DIAGNOSIS — E0590 Thyrotoxicosis, unspecified without thyrotoxic crisis or storm: Secondary | ICD-10-CM

## 2014-01-14 ENCOUNTER — Encounter (HOSPITAL_COMMUNITY)
Admission: RE | Admit: 2014-01-14 | Discharge: 2014-01-14 | Disposition: A | Discharge status: Home / Self Care | Payer: PRIVATE HEALTH INSURANCE | Source: Ambulatory Visit | Attending: Endocrinology | Admitting: Endocrinology

## 2014-01-14 DIAGNOSIS — E0590 Thyrotoxicosis, unspecified without thyrotoxic crisis or storm: Secondary | ICD-10-CM | POA: Diagnosis not present

## 2014-01-14 MED ORDER — SODIUM PERTECHNETATE TC 99M INJECTION
10.5000 | Freq: Once | INTRAVENOUS | Status: AC | PRN
  Administered 2014-01-14: 11 via INTRAVENOUS

## 2014-01-14 MED ORDER — SODIUM IODIDE I 131 CAPSULE
14.0000 | Freq: Once | INTRAVENOUS | Status: AC
  Administered 2014-01-14: 14 via ORAL

## 2014-01-28 ENCOUNTER — Other Ambulatory Visit: Payer: Self-pay | Admitting: Endocrinology

## 2014-01-28 DIAGNOSIS — E0590 Thyrotoxicosis, unspecified without thyrotoxic crisis or storm: Secondary | ICD-10-CM

## 2014-02-06 ENCOUNTER — Inpatient Hospital Stay (HOSPITAL_COMMUNITY)
Admission: RE | Admit: 2014-02-06 | Discharge: 2014-02-06 | Disposition: A | Discharge status: Home / Self Care | Payer: PRIVATE HEALTH INSURANCE | Source: Ambulatory Visit | Attending: Endocrinology | Admitting: Endocrinology

## 2014-03-06 ENCOUNTER — Encounter (HOSPITAL_COMMUNITY)
Admission: RE | Admit: 2014-03-06 | Discharge: 2014-03-06 | Disposition: A | Discharge status: Home / Self Care | Payer: PRIVATE HEALTH INSURANCE | Source: Ambulatory Visit | Attending: Endocrinology | Admitting: Endocrinology

## 2014-03-06 DIAGNOSIS — E0590 Thyrotoxicosis, unspecified without thyrotoxic crisis or storm: Secondary | ICD-10-CM | POA: Insufficient documentation

## 2014-03-06 MED ORDER — SODIUM IODIDE I 131 CAPSULE
20.5000 | Freq: Once | INTRAVENOUS | Status: AC | PRN
  Administered 2014-03-06: 20.5 via ORAL

## 2014-03-11 ENCOUNTER — Telehealth: Payer: Self-pay | Admitting: *Deleted

## 2014-03-11 NOTE — Telephone Encounter (Signed)
LATE ENTRY  PATIENT WALKED INT OFFICE ON 03/04/14 SHE WANTED DR HARDING TO BE AWARE DR Wilson Singer INCREASED - METOPROLOL 100 MG DAILY  SHE STATES HAVING RAI 03/06/14 FOR HYPERACTIVE THYROID.SHE HAS LOST 30 LBS SINCE AUG. DR HARDING REVIEWED  INFORMATION ON 03/10/14.

## 2014-06-09 ENCOUNTER — Other Ambulatory Visit: Payer: Self-pay | Admitting: Cardiology

## 2014-06-09 NOTE — Telephone Encounter (Signed)
Rx(s) sent to pharmacy electronically.  

## 2014-07-14 ENCOUNTER — Other Ambulatory Visit: Payer: Self-pay | Admitting: Cardiology

## 2014-07-14 NOTE — Telephone Encounter (Signed)
Rx(s) sent to pharmacy electronically.  

## 2014-07-20 ENCOUNTER — Other Ambulatory Visit: Payer: Self-pay | Admitting: Cardiology

## 2014-07-20 NOTE — Telephone Encounter (Signed)
Rx(s) sent to pharmacy electronically.  

## 2014-10-26 ENCOUNTER — Other Ambulatory Visit: Payer: Self-pay | Admitting: Endocrinology

## 2014-10-26 DIAGNOSIS — R1310 Dysphagia, unspecified: Secondary | ICD-10-CM

## 2014-11-03 ENCOUNTER — Ambulatory Visit
Admission: RE | Admit: 2014-11-03 | Discharge: 2014-11-03 | Disposition: A | Discharge status: Home / Self Care | Payer: PRIVATE HEALTH INSURANCE | Source: Ambulatory Visit | Attending: Endocrinology | Admitting: Endocrinology

## 2014-11-03 DIAGNOSIS — R1310 Dysphagia, unspecified: Secondary | ICD-10-CM
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
## 2015-07-15 ENCOUNTER — Other Ambulatory Visit (HOSPITAL_COMMUNITY): Payer: Self-pay | Admitting: Endocrinology

## 2015-07-15 DIAGNOSIS — R1310 Dysphagia, unspecified: Secondary | ICD-10-CM

## 2015-07-15 DIAGNOSIS — M542 Cervicalgia: Secondary | ICD-10-CM

## 2015-07-20 ENCOUNTER — Ambulatory Visit (HOSPITAL_COMMUNITY): Admission: RE | Admit: 2015-07-20 | Payer: Managed Care, Other (non HMO) | Source: Ambulatory Visit

## 2015-07-27 ENCOUNTER — Ambulatory Visit (HOSPITAL_COMMUNITY)
Admission: RE | Admit: 2015-07-27 | Discharge: 2015-07-27 | Disposition: A | Discharge status: Home / Self Care | Payer: Managed Care, Other (non HMO) | Source: Ambulatory Visit | Attending: Endocrinology | Admitting: Endocrinology

## 2015-07-27 DIAGNOSIS — M542 Cervicalgia: Secondary | ICD-10-CM

## 2015-07-27 DIAGNOSIS — R1310 Dysphagia, unspecified: Secondary | ICD-10-CM

## 2016-11-23 ENCOUNTER — Encounter: Payer: Self-pay | Admitting: Endocrinology

## 2017-04-05 DIAGNOSIS — E890 Postprocedural hypothyroidism: Secondary | ICD-10-CM | POA: Diagnosis not present

## 2017-04-05 DIAGNOSIS — I10 Essential (primary) hypertension: Secondary | ICD-10-CM | POA: Diagnosis not present

## 2017-04-10 DIAGNOSIS — E890 Postprocedural hypothyroidism: Secondary | ICD-10-CM | POA: Diagnosis not present

## 2017-04-10 DIAGNOSIS — I10 Essential (primary) hypertension: Secondary | ICD-10-CM | POA: Diagnosis not present

## 2017-04-10 DIAGNOSIS — Z1389 Encounter for screening for other disorder: Secondary | ICD-10-CM | POA: Diagnosis not present

## 2017-04-10 DIAGNOSIS — M2550 Pain in unspecified joint: Secondary | ICD-10-CM | POA: Diagnosis not present

## 2017-04-10 DIAGNOSIS — K222 Esophageal obstruction: Secondary | ICD-10-CM | POA: Diagnosis not present

## 2017-04-10 DIAGNOSIS — R209 Unspecified disturbances of skin sensation: Secondary | ICD-10-CM | POA: Diagnosis not present

## 2017-05-03 ENCOUNTER — Encounter: Payer: Self-pay | Admitting: Gastroenterology

## 2017-05-15 ENCOUNTER — Ambulatory Visit: Payer: BLUE CROSS/BLUE SHIELD | Admitting: Gastroenterology

## 2017-05-15 ENCOUNTER — Encounter: Payer: Self-pay | Admitting: Gastroenterology

## 2017-05-15 VITALS — BP 106/62 | HR 80 | Ht 62.75 in | Wt 146.1 lb

## 2017-05-15 DIAGNOSIS — R1310 Dysphagia, unspecified: Secondary | ICD-10-CM

## 2017-05-15 DIAGNOSIS — R1319 Other dysphagia: Secondary | ICD-10-CM

## 2017-05-15 DIAGNOSIS — K224 Dyskinesia of esophagus: Secondary | ICD-10-CM | POA: Diagnosis not present

## 2017-05-15 DIAGNOSIS — R1030 Lower abdominal pain, unspecified: Secondary | ICD-10-CM

## 2017-05-15 DIAGNOSIS — K219 Gastro-esophageal reflux disease without esophagitis: Secondary | ICD-10-CM | POA: Diagnosis not present

## 2017-05-15 NOTE — Patient Instructions (Addendum)
If you are age 64 or older, your body mass index should be between 23-30. Your Body mass index is 26.09 kg/m. If this is out of the aforementioned range listed, please consider follow up with your Primary Care Provider.  If you are age 57 or younger, your body mass index should be between 19-25. Your Body mass index is 26.09 kg/m. If this is out of the aformentioned range listed, please consider follow up with your Primary Care Provider.   It has been recommended to you by your physician that you have a(n) colonoscopy completed. Per your request, we did not schedule the procedure(s) today. Please contact our office at [phone] should you decide to have the procedure completed.    Thank you for choosing [facility]  Dr Wilfrid Lund III

## 2017-05-15 NOTE — Progress Notes (Signed)
[facility] Consult Note:  History: COLEY LITTLES 05/15/2017  Referring physician: Reynold Bowen, MD  Reason for consult/chief complaint: Dysphagia; Gastroesophageal Reflux; abdominal discomfort (burning in lower abd); and Weight Loss (7-8 lbs in 1 month without trying)   Subjective  HPI:  Last note by Dr. Olevia Perches in 12/2013:"This is a 62 year old, white female who I have been following for esophageal spasms for 20 years. She now has developed lower abdominal pain shortly after having a right knee replacement on 10/06/2013. She started on Mobic 15 mg twice a day right after the surgery and continued for 9 weeks as well as on aspirin. She is  Having abdominal  pain within 5 minutes of eating. It does not bother her at night. She has lost 20 pounds in the last 2 months, from an initial 159 pounds to 138 pounds today. There has been no nausea or vomiting but she is having some epigastric discomfort. Most of her pain however is located in the lower abdomen. She denies being constipated and denies any visible blood per rectum. Her last colonoscopy in November 2005 was normal. She had numerous upper endoscopies for evaluation of esophageal spasm and dysphagia starting in 1990, 1994, 1995, 1999, 2003, 2005 and last time in November 2010. She has a tortuous esophagus which was dilated with 54 [place] Maloney dilator. She has had a prior cholecystectomy. She is currently on Prevacid 30 mg twice a day and TUMS. There is no family history of colon cancer."  EGD and colonoscopy was planned but canceled due to hyperthyroidism.  Trude was sent for consultation by Dr. Reynold Bowen for reevaluation of dysphagia and abdominal pain. She reports difficulty controlling thyroid med with fluctuating weight and anxiety hard to control.  She has edema and "chest fluid" causing tightness.  Diuretics seem to cause low potassium. Chronic lower abdominal burning after melas.  BM's with occasional  constipation when does not get enough dietary fiber.  Reports a large amount of stress related to her thyroid and home life. Anxiety triggers GI symptoms by her observation. Xanax also helps GI symptoms.  EGD 01/2009 no stricture, 50 Fr Maloney dilator passed. Advised to take nexium and prn xanax for esophageal spasm.  She has heartburn for which she takes BID PPI.  Intermittent dysphagia to meat/bread  Nimah is very worried about prospect of having endoscopic procedures done due to low potassium.  ROS:  Review of Systems  Constitutional: Negative for appetite change and unexpected weight change.  HENT: Negative for mouth sores and voice change.   Eyes: Negative for pain and redness.  Respiratory: Positive for shortness of breath. Negative for cough.   Cardiovascular: Negative for chest pain and palpitations.  Genitourinary: Negative for dysuria and hematuria.  Musculoskeletal: Negative for arthralgias and myalgias.  Skin: Negative for pallor and rash.  Neurological: Negative for weakness and headaches.  Hematological: Negative for adenopathy.  Psychiatric/Behavioral: The patient is nervous/anxious.    No rectal bleeding  Past Medical History: Past Medical History:  Diagnosis Date  . Abnormal LFTs   . Anxiety   . Arthritis   . Depression   . Episode of syncope 4 years ago  . Esophageal spasm   . Esophageal stricture   . Gastric polyp   . GERD (gastroesophageal reflux disease)   . Graves disease   . H/O bronchitis   . Hyperlipidemia   . Hypertension   . Hyperthyroidism   . Osteopenia    hips  . PONV (postoperative nausea and  vomiting)    versed and dilaudid at surgery center     Past Surgical History: Past Surgical History:  Procedure Laterality Date  . ABDOMINAL HYSTERECTOMY  age 84  . CARPAL TUNNEL RELEASE Bilateral 1980's  . CHOLECYSTECTOMY  1997  . ELBOW SURGERY Right 1999  . ESOPHAGEAL DILATION    . FRACTURE SURGERY Left 2006   wrist  . KNEE  ARTHROSCOPY Right 10/06/2013   Procedure: RIGHT KNEE ARTHROSCOPY LATERAL COMPARTMENTAL DEBRIDEMENT, PARTIAL LATERAL MENISCECTOMY;  Surgeon: [name], MD;  Location: [facility];  Service: Orthopedics;  Laterality: Right;  . KNEE SURGERY  40 years ago   right  . PARTIAL KNEE ARTHROPLASTY Right 10/06/2013   Procedure: RIGHT MEDIAL UNICOMPARTMENTAL KNEE ARTHROPLASTY;  Surgeon: [name], MD;  Location: [facility];  Service: Orthopedics;  Laterality: Right;     Family History: Family History  Problem Relation Age of Onset  . Heart disease Father   . Breast cancer Mother   . Diabetes Mother   . Heart disease Mother   . Irritable bowel syndrome Mother   . Prostate cancer Paternal Grandfather   . Clotting disorder Son        hemophilia  . Colon cancer Neg Hx     Social History: Social History   Socioeconomic History  . Marital status: Married    Spouse name: None  . Number of children: 2  . Years of education: None  . Highest education level: None  Social Needs  . Financial resource strain: None  . Food insecurity - worry: None  . Food insecurity - inability: None  . Transportation needs - medical: None  . Transportation needs - non-medical: None  Occupational History  . Occupation: [employer]  Tobacco Use  . Smoking status: Never Smoker  . Smokeless tobacco: Never Used  Substance and Sexual Activity  . Alcohol use: Yes    Comment: occasional  . Drug use: No  . Sexual activity: None  Other Topics Concern  . None  Social History Narrative  . None    Allergies: Allergies  Allergen Reactions  . Hydrocodone-Acetaminophen Nausea And Vomiting  . Other     Anesthesia makes her vomit  . Oxycodone Nausea And Vomiting  . Nsaids Nausea Only    Outpatient Meds: Current Outpatient Medications  Medication Sig Dispense Refill  . ALPRAZolam (XANAX) 0.5 MG tablet Take 0.5 mg by mouth 3 (three) times daily as needed for anxiety.    . fenofibrate 160 MG tablet Take 160  mg by mouth daily.    . lansoprazole (PREVACID) 15 MG capsule Take 15 mg by mouth 2 (two) times daily before a meal.    . levothyroxine (SYNTHROID, LEVOTHROID) 100 MCG tablet Take 100 mcg by mouth. 2 tabs Mon-Sat, 1 Sunday    . metoprolol succinate (TOPROL-XL) 50 MG 24 hr tablet Take 25 mg by mouth daily. Take with or immediately following a meal.    . torsemide (DEMADEX) 20 MG tablet Take 20 mg by mouth daily.    Marland Kitchen triamterene-hydrochlorothiazide (DYAZIDE) 37.5-25 MG per capsule Take 1 capsule by mouth every morning.     No current facility-administered medications for this visit.       ___________________________________________________________________ Objective   Exam:  BP 106/62 (BP Location: Left Arm, Patient Position: Sitting)   Pulse 80   Ht 5' 2.75" (1.594 m) Comment: height measured without shoes  Wt 146 lb 2 oz (66.3 kg)   BMI 26.09 kg/m    General: this is  a(n) well -appearing woman with good muscle mass and normal vocal quality   Eyes: sclera anicteric, no redness  ENT: oral mucosa moist without lesions, no cervical or supraclavicular lymphadenopathy, good dentition  CV: RRR without murmur, S1/S2, no JVD, no peripheral edema  Resp: clear to auscultation bilaterally, normal RR and effort noted  GI: soft, mild scattered tenderness to light palpation, with active bowel sounds. No guarding or palpable organomegaly noted.  Skin; warm and dry, no rash or jaundice noted  Neuro: awake, alert and oriented x 3. Normal gross motor function and fluent speech  Labs:  TSH low at 0.01 on Jan 31st K+ = 3.6  Limited colonoscopy report from 01/2004 says complete to cecum, "no polyps"  Assessment: Encounter Diagnoses  Name Primary?  . Esophageal dysphagia Yes  . Gastroesophageal reflux disease without esophagitis   . Lower abdominal pain   . Esophageal spasm     Seems to be a significant anxiety trigger to GI symptoms. Sounds like her chronic esophageal spasm rather  than mechanical cause of dysphagia.  Plan:  Doubt EGD would be revealing any more than it had been multiple times before. Already on BID PPI.  Needs screening colonoscopy - she does not feel ready but agrees to call when she is ready.  Thank you for the courtesy of this consult.  Please call me with any questions or concerns.  Nelida Meuse III  CC: Reynold Bowen, MD

## 2017-05-16 ENCOUNTER — Other Ambulatory Visit: Payer: Self-pay

## 2017-08-07 DIAGNOSIS — E890 Postprocedural hypothyroidism: Secondary | ICD-10-CM | POA: Diagnosis not present

## 2017-08-08 DIAGNOSIS — Z1389 Encounter for screening for other disorder: Secondary | ICD-10-CM | POA: Diagnosis not present

## 2017-08-08 DIAGNOSIS — K222 Esophageal obstruction: Secondary | ICD-10-CM | POA: Diagnosis not present

## 2017-08-08 DIAGNOSIS — F3289 Other specified depressive episodes: Secondary | ICD-10-CM | POA: Diagnosis not present

## 2017-08-08 DIAGNOSIS — E890 Postprocedural hypothyroidism: Secondary | ICD-10-CM | POA: Diagnosis not present

## 2017-08-08 DIAGNOSIS — I10 Essential (primary) hypertension: Secondary | ICD-10-CM | POA: Diagnosis not present

## 2017-09-25 DIAGNOSIS — M542 Cervicalgia: Secondary | ICD-10-CM | POA: Diagnosis not present

## 2017-10-01 DIAGNOSIS — M542 Cervicalgia: Secondary | ICD-10-CM | POA: Diagnosis not present

## 2017-10-10 DIAGNOSIS — M542 Cervicalgia: Secondary | ICD-10-CM | POA: Diagnosis not present

## 2017-12-26 DIAGNOSIS — M25562 Pain in left knee: Secondary | ICD-10-CM | POA: Diagnosis not present

## 2017-12-26 DIAGNOSIS — M238X2 Other internal derangements of left knee: Secondary | ICD-10-CM | POA: Diagnosis not present

## 2018-01-04 DIAGNOSIS — I10 Essential (primary) hypertension: Secondary | ICD-10-CM | POA: Diagnosis not present

## 2018-01-07 DIAGNOSIS — E890 Postprocedural hypothyroidism: Secondary | ICD-10-CM | POA: Diagnosis not present

## 2018-01-11 DIAGNOSIS — I10 Essential (primary) hypertension: Secondary | ICD-10-CM | POA: Diagnosis not present

## 2018-01-11 DIAGNOSIS — E890 Postprocedural hypothyroidism: Secondary | ICD-10-CM | POA: Diagnosis not present

## 2018-01-11 DIAGNOSIS — E7849 Other hyperlipidemia: Secondary | ICD-10-CM | POA: Diagnosis not present

## 2018-01-11 DIAGNOSIS — K222 Esophageal obstruction: Secondary | ICD-10-CM | POA: Diagnosis not present

## 2018-01-18 DIAGNOSIS — M859 Disorder of bone density and structure, unspecified: Secondary | ICD-10-CM | POA: Diagnosis not present

## 2018-07-08 ENCOUNTER — Other Ambulatory Visit: Payer: Self-pay

## 2018-07-08 ENCOUNTER — Encounter: Payer: Self-pay | Admitting: Gastroenterology

## 2018-07-08 ENCOUNTER — Ambulatory Visit (INDEPENDENT_AMBULATORY_CARE_PROVIDER_SITE_OTHER): Payer: BLUE CROSS/BLUE SHIELD | Admitting: Gastroenterology

## 2018-07-08 DIAGNOSIS — K219 Gastro-esophageal reflux disease without esophagitis: Secondary | ICD-10-CM

## 2018-07-08 NOTE — Progress Notes (Signed)
Video conference visit had been set up with my office.  I was unable to reach the patient on her home/mobile number several times between 11 AM and 12:40 PM.

## 2018-07-08 NOTE — Progress Notes (Deleted)
This patient contacted our office requesting a physician telemedicine video consultation regarding clinical questions and/or test results.  If new patient, they were referred by ***  Participants on the *** : ***   The patient consented to phone consultation and was aware that a charge will be placed through their insurance.  I was in my office and the patient was ***  ***  Encounter time:  Total time *** minutes, with *** minutes spent with patient on phone/webex    Wilfrid Lund, MD   _____________________________________________________________________________________________               [facility] Progress Note  Chief Complaint: ***  Subjective  History:  Last seen February 2019 after previous care with Dr. Olevia Perches.  Chronic chest discomfort due to esophageal spasm as well as chronic heartburn despite twice daily PPI.  Dr. Olevia Perches suspected possible anxiety component to symptoms, as patient had previously reported relief when she would take Xanax.  ROS: Cardiovascular:  no chest pain Respiratory: no dyspnea  The patient's Past Medical, Family and Social History were reviewed and are on file in the EMR.  Objective:  Med list reviewed  Current Outpatient Medications:  .  ALPRAZolam (XANAX) 0.5 MG tablet, Take 0.5 mg by mouth 3 (three) times daily as needed for anxiety., Disp: , Rfl:  .  fenofibrate 160 MG tablet, Take 160 mg by mouth daily., Disp: , Rfl:  .  lansoprazole (PREVACID) 15 MG capsule, Take 15 mg by mouth 2 (two) times daily before a meal., Disp: , Rfl:  .  levothyroxine (SYNTHROID, LEVOTHROID) 100 MCG tablet, Take 100 mcg by mouth. 2 tabs Mon-Sat, 1 Sunday, Disp: , Rfl:  .  metoprolol succinate (TOPROL-XL) 50 MG 24 hr tablet, Take 25 mg by mouth daily. Take with or immediately following a meal., Disp: , Rfl:  .  torsemide (DEMADEX) 20 MG tablet, Take 20 mg by mouth daily., Disp: , Rfl:  .  triamterene-hydrochlorothiazide (DYAZIDE) 37.5-25 MG per  capsule, Take 1 capsule by mouth every morning., Disp: , Rfl:     No exam -virtual visit  Recent Labs:    Radiologic studies:    @ASSESSMENTPLANBEGIN @ Assessment: No diagnosis found.    Plan:    Total time *** minutes, over half spent face-to-face with patient in counseling and coordination of care.   Nelida Meuse III

## 2018-08-09 DIAGNOSIS — R109 Unspecified abdominal pain: Secondary | ICD-10-CM | POA: Diagnosis not present

## 2018-09-04 DIAGNOSIS — M545 Low back pain: Secondary | ICD-10-CM | POA: Diagnosis not present

## 2019-02-07 DIAGNOSIS — Z801 Family history of malignant neoplasm of trachea, bronchus and lung: Secondary | ICD-10-CM | POA: Diagnosis not present

## 2019-02-07 DIAGNOSIS — Z808 Family history of malignant neoplasm of other organs or systems: Secondary | ICD-10-CM | POA: Diagnosis not present

## 2019-02-07 DIAGNOSIS — N816 Rectocele: Secondary | ICD-10-CM | POA: Diagnosis not present

## 2019-02-07 DIAGNOSIS — Z6822 Body mass index (BMI) 22.0-22.9, adult: Secondary | ICD-10-CM | POA: Diagnosis not present

## 2019-02-07 DIAGNOSIS — Z01411 Encounter for gynecological examination (general) (routine) with abnormal findings: Secondary | ICD-10-CM | POA: Diagnosis not present

## 2019-02-07 DIAGNOSIS — Z1272 Encounter for screening for malignant neoplasm of vagina: Secondary | ICD-10-CM | POA: Diagnosis not present

## 2019-02-07 DIAGNOSIS — N8111 Cystocele, midline: Secondary | ICD-10-CM | POA: Diagnosis not present

## 2019-02-07 DIAGNOSIS — Z803 Family history of malignant neoplasm of breast: Secondary | ICD-10-CM | POA: Diagnosis not present

## 2019-03-04 DIAGNOSIS — E7849 Other hyperlipidemia: Secondary | ICD-10-CM | POA: Diagnosis not present

## 2019-03-05 DIAGNOSIS — Z1331 Encounter for screening for depression: Secondary | ICD-10-CM | POA: Diagnosis not present

## 2019-03-05 DIAGNOSIS — E890 Postprocedural hypothyroidism: Secondary | ICD-10-CM | POA: Diagnosis not present

## 2019-03-05 DIAGNOSIS — M8580 Other specified disorders of bone density and structure, unspecified site: Secondary | ICD-10-CM | POA: Diagnosis not present

## 2019-03-05 DIAGNOSIS — I10 Essential (primary) hypertension: Secondary | ICD-10-CM | POA: Diagnosis not present

## 2019-03-05 DIAGNOSIS — E785 Hyperlipidemia, unspecified: Secondary | ICD-10-CM | POA: Diagnosis not present

## 2019-04-23 ENCOUNTER — Other Ambulatory Visit: Payer: Self-pay | Admitting: Obstetrics and Gynecology

## 2019-04-23 DIAGNOSIS — N819 Female genital prolapse, unspecified: Secondary | ICD-10-CM

## 2019-04-24 ENCOUNTER — Ambulatory Visit
Admission: RE | Admit: 2019-04-24 | Discharge: 2019-04-24 | Disposition: A | Discharge status: Home / Self Care | Payer: Medicare Other | Source: Ambulatory Visit | Attending: Obstetrics and Gynecology | Admitting: Obstetrics and Gynecology

## 2019-04-24 DIAGNOSIS — N819 Female genital prolapse, unspecified: Secondary | ICD-10-CM

## 2019-05-03 ENCOUNTER — Ambulatory Visit: Payer: Medicare Other

## 2019-05-09 ENCOUNTER — Other Ambulatory Visit: Payer: Self-pay | Admitting: Obstetrics and Gynecology

## 2019-05-13 NOTE — H&P (Addendum)
Carolyn [name] is a 64 y.o. female, P: 1-0-0-1 who presents for an anterior-posterior colporrhaphy because of vaginal prolapse.  In January of this year the patient noticed, while showering, that she felt a protrusion at her vaginal opening.  Additionally she had been experiencing urinary frequency, urgency, post void dribbling, a sensation of incomplete bladder emptying, stinging sensations in her bladder and pelvic pressure that was relieved with lying down.  She denies any incontinence, vaginitis symptoms or changes in bowel function.  On exam in February the patient was found to have a large rectocele, enterocele and cystocele.  A pelvic ultrasound on April 24, 2019  was unremarkable except for a  complicated right adnexal cyst measuring 2.7 cm-most likely ovarian in origin.   She was given both medical and surgical management options however, she wants to proceed with surgical management.    Past Medical History  OB History: G:1 ;  P: 1-0-0-1;  SVB: 1978-9 lb infant  GYN History: menarche: [id number];    Denies history of abnormal PAP smear.   Last PAP smear: 2020-normal  Medical History: Osteopenia, Graves Disease (has had radiation ablative therapy), Hyperlipidemia, GERD, Gastric Polyp, Esophageal Stricture, Depression, Arthritis, Anxiety and Chronic Back Pain  Surgical History: 1980: Bilateral Carpal Tunnel Release;  1983 Vaginal Hysterectomy/A-P Colporrhaphy;  1990 Esophageal Dilatation; 1997 Cholecystectomy; 1999 Right Elbow Surgery; 2007 Left Wrist Surgery w/Titanium Plate Placement and [id number] Right Knee Replacement Has severe nausea and vomiting  with anesthesia.  No  history of blood transfusions  Family History: Cancers (breast, lung, pancreatic and bone), IBS, Heart Disease, Stroke and Hemophilia  Social History: Married and employed with [employer];  Denies tobacco use and consumes alcohol occasioally  Medications: Alprazolam 0.5 mg prn Fenofibrate 160 mg daily Levothyroxine 200  mcg daily Potassium daily Prevacid prn Torsemide 20 mg daily triamterene 37.5/HCTZ 25 mg daily   Allergies  Allergen Reactions  . Hydrocodone-Acetaminophen Nausea And Vomiting  . Other     Anesthesia makes her vomit  . Oxycodone Nausea And Vomiting  . Nsaids Nausea Only  Tramadol causes severe nausea and vomiting   Denies sensitivity to peanuts, shellfish, soy, latex or adhesives.   ROS: Admits to glasses, lower extremity edema (without diuretics) and  urinary tract symptoms (see HPI) but denies headache, vision changes, nasal congestion, dysphagia, tinnitus, dizziness, hoarseness, cough,  chest pain, shortness of breath, nausea, vomiting, diarrhea,constipation,   hematuria, vaginitis symptoms, pelvic pain, swelling of joints,easy bruising,  myalgias, arthralgias, skin rashes, unexplained weight loss and except as is mentioned in the history of present illness, patient's review of systems is otherwise negative.     Physical Exam  Bp: 110/56   P: 87 bpm  R: 16  Temperature: 97.7 degrees F orally  Weight: 142 lbs.  Height: 5' 2.5" BMI: 25.6  Neck: supple without masses or thyromegaly Lungs: clear to auscultation Heart: regular rate and rhythm Abdomen: soft, supra-pubic tenderness but  no organomegaly Pelvic:EGBUS- wnl; vagina-normal with vaginal vault visible at vaginal opening and slightly bulging; uterus and  cervix surgically absent adnexae-no tenderness or masses Extremities:  no clubbing, cyanosis or edema   Assesment: Symptomatic Rectocele                       Symptomatic Cystocele                        Enterocele   Disposition:  A discussion was held with patient regarding the indication  for her procedure(s) along with the risks, which include but are not limited to: reaction to anesthesia, damage to adjacent organs, infection and excessive bleeding. The patient verbalized understanding of these risks and has consented to proceed with an [place] and Cystoscopy at [facility] on May 21, 2019 at 10 :45 a.m.   CSN# [id number]   [place] J. Florene Glen, PA-C  for Dr. Harvie Bridge. Mancel Bale   Reviewed procedure risks benefits alternatives.  Pt denies any contraindications to receiving vaginal estrogen.  I reviewed her ultrasound from 04-24-19 and explained she will need a f/u ultrasound in April and if cyst persists, I recommend laparoscopic removal.  Questions answered and consent signed and witnessed.

## 2019-05-16 NOTE — Pre-Procedure Instructions (Signed)
Carolyn [name]  05/16/2019    Your procedure is scheduled on Wednesday, May 21, 2019 at  11:00 AM.   Report to [facility] Entrance "A" Admitting Office at 9:00 AM.   Call this number if you have problems the morning of surgery: [phone]   Questions prior to day of surgery, please call [phone] between 8 & 4 PM.   Remember:  Do not eat or drink after midnight Tuesday, 05/20/19.  Take these medicines the morning of surgery with A SIP OF WATER: Lansoprazole (Prevacid), Levothyroxine (Synthroid), Alprazolam (Xanax) - if needed, Tylenol - if needed  Do not use NSAIDS (Ibuprofen, Aleve, etc), Aspirin products (BC Powders, Goody's, etc), Multivitamins, Fish Oil or Herbal medications prior to surgery.    Do not wear jewelry, make-up or nail polish.  Do not wear lotions, powders, perfumes or deodorant.  Do not shave 48 hours prior to surgery.    Do not bring valuables to the hospital.  [facility] is not responsible for any belongings or valuables.  Contacts, dentures or bridgework may not be worn into surgery.  Leave your suitcase in the car.  After surgery it may be brought to your room.  For patients admitted to the hospital, discharge time will be determined by your treatment team.  Patients discharged the day of surgery will not be allowed to drive home.   [place] - Preparing for Surgery  Before surgery, you can play an important role.  Because skin is not sterile, your skin needs to be as free of germs as possible.  You can reduce the number of germs on you skin by washing with CHG (chlorahexidine gluconate) soap before surgery.  CHG is an antiseptic cleaner which kills germs and bonds with the skin to continue killing germs even after washing.  Oral Hygiene is also important in reducing the risk of infection.  Remember to brush your teeth with your regular toothpaste the morning of surgery.  Please DO NOT use if you have an allergy to CHG or antibacterial soaps.   If your skin becomes reddened/irritated stop using the CHG and inform your nurse when you arrive at Short Stay.  Do not shave (including legs and underarms) for at least 48 hours prior to the first CHG shower.  You may shave your face.  Please follow these instructions carefully:   1.  Shower with CHG Soap the night before surgery and the morning of Surgery.  2.  If you choose to wash your hair, wash your hair first as usual with your normal shampoo.  3.  After you shampoo, rinse your hair and body thoroughly to remove the shampoo. 4.  Use CHG as you would any other liquid soap.  You can apply chg directly to the skin and wash gently with a      scrungie or washcloth.           5.  Apply the CHG Soap to your body ONLY FROM THE NECK DOWN.   Do not use on open wounds or open sores. Avoid contact with your eyes, ears, mouth and genitals (private parts).  Wash genitals (private parts) with your normal soap - do this prior to using CHG soap.  6.  Wash thoroughly, paying special attention to the area where your surgery will be performed.  7.  Thoroughly rinse your body with warm water from the neck down.  8.  DO NOT shower/wash with your normal soap after using and rinsing off the  CHG Soap.  9.  Pat yourself dry with a clean towel.            10.  Wear clean pajamas.            11.  Place clean sheets on your bed the night of your first shower and do not sleep with pets.  Day of Surgery  Shower as above. Do not apply any lotions/deodorants the morning of surgery.   Please wear clean clothes to the hospital. Remember to brush your teeth with toothpaste.  Please read over the fact sheets that you were given.

## 2019-05-19 ENCOUNTER — Other Ambulatory Visit: Payer: Self-pay

## 2019-05-19 ENCOUNTER — Encounter (HOSPITAL_COMMUNITY)
Admission: RE | Admit: 2019-05-19 | Discharge: 2019-05-19 | Disposition: A | Discharge status: Home / Self Care | Payer: Medicare Other | Source: Ambulatory Visit | Attending: Obstetrics and Gynecology | Admitting: Obstetrics and Gynecology

## 2019-05-19 ENCOUNTER — Other Ambulatory Visit (HOSPITAL_COMMUNITY)
Admission: RE | Admit: 2019-05-19 | Discharge: 2019-05-19 | Disposition: A | Discharge status: Home / Self Care | Payer: Medicare Other | Source: Ambulatory Visit | Attending: Obstetrics and Gynecology | Admitting: Obstetrics and Gynecology

## 2019-05-19 ENCOUNTER — Encounter (HOSPITAL_COMMUNITY): Payer: Self-pay

## 2019-05-19 DIAGNOSIS — Z20822 Contact with and (suspected) exposure to covid-19: Secondary | ICD-10-CM | POA: Insufficient documentation

## 2019-05-19 DIAGNOSIS — I10 Essential (primary) hypertension: Secondary | ICD-10-CM | POA: Diagnosis not present

## 2019-05-19 DIAGNOSIS — Z01818 Encounter for other preprocedural examination: Secondary | ICD-10-CM | POA: Diagnosis present

## 2019-05-19 HISTORY — DX: Pneumonia, unspecified organism: J18.9

## 2019-05-19 HISTORY — DX: Family history of other specified conditions: Z84.89

## 2019-05-19 HISTORY — DX: Personal history of other diseases of the digestive system: Z87.19

## 2019-05-19 LAB — BASIC METABOLIC PANEL
Anion gap: 10 (ref 5–15)
BUN: 19 mg/dL (ref 8–23)
CO2: 29 mmol/L (ref 22–32)
Calcium: 9.6 mg/dL (ref 8.9–10.3)
Chloride: 99 mmol/L (ref 98–111)
Creatinine, Ser: 0.89 mg/dL (ref 0.44–1.00)
GFR calc Af Amer: >60 mL/min (ref >60)
GFR calc non Af Amer: >60 mL/min (ref >60)
Glucose, Bld: 125 mg/dL — ABNORMAL HIGH (ref 70–99)
Potassium: 3.6 mmol/L (ref 3.5–5.1)
Sodium: 138 mmol/L (ref 135–145)

## 2019-05-19 LAB — CBC
HCT: 39.3 % (ref 36.0–46.0)
Hemoglobin: 13.3 g/dL (ref 12.0–15.0)
MCH: 30 pg (ref 26.0–34.0)
MCHC: 33.8 g/dL (ref 30.0–36.0)
MCV: 88.7 fL (ref 80.0–100.0)
Platelets: 328 10*3/uL (ref 150–400)
RBC: 4.43 MIL/uL (ref 3.87–5.11)
RDW: 12 % (ref 11.5–15.5)
WBC: 8.2 10*3/uL (ref 4.0–10.5)
nRBC: 0 % (ref 0.0–0.2)

## 2019-05-19 NOTE — Progress Notes (Signed)
PCP - Dr. Reynold Bowen Cardiologist - saw Dr. Ellyn Hack in the past for HTN, Dr. Forde Dandy now follows her HTN.   Chest x-ray - N/A EKG - today  COVID TEST- to be done today.   Anesthesia review: None  Patient denies shortness of breath, fever, cough and chest pain at PAT appointment   All instructions explained to the patient, with a verbal understanding of the material. Patient agrees to go over the instructions while at home for a better understanding. Patient also instructed to self quarantine after being tested for COVID-19. The opportunity to ask questions was provided.

## 2019-05-20 LAB — SARS CORONAVIRUS 2 (TAT 6-24 HRS): SARS Coronavirus 2: NEGATIVE

## 2019-05-21 ENCOUNTER — Encounter (HOSPITAL_COMMUNITY)
Admission: RE | Disposition: A | Discharge status: Home / Self Care | Payer: Self-pay | Source: Home / Self Care | Attending: Obstetrics and Gynecology

## 2019-05-21 ENCOUNTER — Ambulatory Visit (HOSPITAL_COMMUNITY): Payer: Medicare Other | Admitting: Anesthesiology

## 2019-05-21 ENCOUNTER — Other Ambulatory Visit: Payer: Self-pay

## 2019-05-21 ENCOUNTER — Observation Stay (HOSPITAL_COMMUNITY)
Admission: RE | Admit: 2019-05-21 | Discharge: 2019-05-22 | Disposition: A | Discharge status: Home / Self Care | Payer: Medicare Other | Attending: Obstetrics and Gynecology | Admitting: Obstetrics and Gynecology

## 2019-05-21 ENCOUNTER — Encounter (HOSPITAL_COMMUNITY): Payer: Self-pay | Admitting: Obstetrics and Gynecology

## 2019-05-21 DIAGNOSIS — F419 Anxiety disorder, unspecified: Secondary | ICD-10-CM | POA: Diagnosis not present

## 2019-05-21 DIAGNOSIS — K469 Unspecified abdominal hernia without obstruction or gangrene: Secondary | ICD-10-CM | POA: Insufficient documentation

## 2019-05-21 DIAGNOSIS — I10 Essential (primary) hypertension: Secondary | ICD-10-CM | POA: Insufficient documentation

## 2019-05-21 DIAGNOSIS — F329 Major depressive disorder, single episode, unspecified: Secondary | ICD-10-CM | POA: Diagnosis not present

## 2019-05-21 DIAGNOSIS — Z96651 Presence of right artificial knee joint: Secondary | ICD-10-CM | POA: Diagnosis not present

## 2019-05-21 DIAGNOSIS — N814 Uterovaginal prolapse, unspecified: Secondary | ICD-10-CM

## 2019-05-21 DIAGNOSIS — K219 Gastro-esophageal reflux disease without esophagitis: Secondary | ICD-10-CM | POA: Insufficient documentation

## 2019-05-21 DIAGNOSIS — D649 Anemia, unspecified: Secondary | ICD-10-CM | POA: Insufficient documentation

## 2019-05-21 DIAGNOSIS — E039 Hypothyroidism, unspecified: Secondary | ICD-10-CM | POA: Insufficient documentation

## 2019-05-21 DIAGNOSIS — Z79899 Other long term (current) drug therapy: Secondary | ICD-10-CM | POA: Diagnosis not present

## 2019-05-21 DIAGNOSIS — N8110 Cystocele, unspecified: Secondary | ICD-10-CM | POA: Diagnosis present

## 2019-05-21 DIAGNOSIS — N993 Prolapse of vaginal vault after hysterectomy: Principal | ICD-10-CM | POA: Insufficient documentation

## 2019-05-21 HISTORY — PX: ANTERIOR AND POSTERIOR REPAIR: SHX5121

## 2019-05-21 HISTORY — PX: CYSTOSCOPY: SHX5120

## 2019-05-21 SURGERY — ANTERIOR (CYSTOCELE) AND POSTERIOR REPAIR (RECTOCELE)
Anesthesia: General | Site: Vagina

## 2019-05-21 MED ORDER — SIMETHICONE 80 MG PO CHEW: 80.0000 mg | CHEWABLE_TABLET | Freq: Four times a day (QID) | ORAL | Status: DC | PRN

## 2019-05-21 MED ORDER — DEXAMETHASONE SODIUM PHOSPHATE 10 MG/ML IJ SOLN
INTRAMUSCULAR | Status: AC
  Filled 2019-05-21: qty 1

## 2019-05-21 MED ORDER — LIDOCAINE 2% (20 MG/ML) 5 ML SYRINGE
INTRAMUSCULAR | Status: DC | PRN
  Administered 2019-05-21: 60 mg via INTRAVENOUS

## 2019-05-21 MED ORDER — DOCUSATE SODIUM 100 MG PO CAPS
100.0000 mg | ORAL_CAPSULE | Freq: Two times a day (BID) | ORAL | Status: DC
  Administered 2019-05-22: 100 mg via ORAL
  Filled 2019-05-21: qty 1

## 2019-05-21 MED ORDER — ACETAMINOPHEN 325 MG PO TABS
650.0000 mg | ORAL_TABLET | Freq: Four times a day (QID) | ORAL | Status: DC
  Administered 2019-05-21 – 2019-05-22 (×3): 650 mg via ORAL
  Filled 2019-05-21 (×3): qty 2

## 2019-05-21 MED ORDER — SCOPOLAMINE 1 MG/3DAYS TD PT72
MEDICATED_PATCH | TRANSDERMAL | Status: AC
  Administered 2019-05-21: 1.5 mg via TRANSDERMAL
  Filled 2019-05-21: qty 1

## 2019-05-21 MED ORDER — MIDAZOLAM HCL 2 MG/2ML IJ SOLN
INTRAMUSCULAR | Status: DC | PRN
  Administered 2019-05-21: 2 mg via INTRAVENOUS

## 2019-05-21 MED ORDER — VASOPRESSIN 20 UNIT/ML IV SOLN
INTRAVENOUS | Status: DC | PRN
  Administered 2019-05-21: 30 mL via INTRAMUSCULAR
  Administered 2019-05-21: 13:00:00 32 mL via INTRAMUSCULAR

## 2019-05-21 MED ORDER — ESTRADIOL 0.1 MG/GM VA CREA
TOPICAL_CREAM | VAGINAL | Status: DC | PRN
  Administered 2019-05-21: 1 via VAGINAL

## 2019-05-21 MED ORDER — FENTANYL CITRATE (PF) 100 MCG/2ML IJ SOLN
25.0000 ug | INTRAMUSCULAR | Status: DC | PRN
  Administered 2019-05-21: 17:00:00 50 ug via INTRAVENOUS

## 2019-05-21 MED ORDER — DEXAMETHASONE SODIUM PHOSPHATE 10 MG/ML IJ SOLN
INTRAMUSCULAR | Status: DC | PRN
  Administered 2019-05-21: 5 mg via INTRAVENOUS

## 2019-05-21 MED ORDER — KETOROLAC TROMETHAMINE 30 MG/ML IJ SOLN
30.0000 mg | Freq: Four times a day (QID) | INTRAMUSCULAR | Status: DC
  Administered 2019-05-21 – 2019-05-22 (×2): 30 mg via INTRAVENOUS
  Filled 2019-05-21 (×3): qty 1

## 2019-05-21 MED ORDER — ONDANSETRON HCL 4 MG/2ML IJ SOLN: 4.0000 mg | Freq: Four times a day (QID) | INTRAMUSCULAR | Status: DC | PRN

## 2019-05-21 MED ORDER — ONDANSETRON HCL 4 MG/2ML IJ SOLN
INTRAMUSCULAR | Status: AC
  Filled 2019-05-21: qty 2

## 2019-05-21 MED ORDER — TORSEMIDE 20 MG PO TABS: 20.0000 mg | ORAL_TABLET | Freq: Every day | ORAL | Status: DC

## 2019-05-21 MED ORDER — NALOXONE HCL 0.4 MG/ML IJ SOLN
0.4000 mg | INTRAMUSCULAR | Status: DC | PRN
  Filled 2019-05-21: qty 1

## 2019-05-21 MED ORDER — MEPERIDINE HCL 25 MG/ML IJ SOLN: 6.2500 mg | INTRAMUSCULAR | Status: DC | PRN

## 2019-05-21 MED ORDER — DIPHENHYDRAMINE HCL 50 MG/ML IJ SOLN
INTRAMUSCULAR | Status: DC | PRN
  Administered 2019-05-21: 6.25 mg via INTRAVENOUS

## 2019-05-21 MED ORDER — ACETAMINOPHEN 500 MG PO TABS
ORAL_TABLET | ORAL | Status: AC
  Administered 2019-05-21: 1000 mg via ORAL
  Filled 2019-05-21: qty 2

## 2019-05-21 MED ORDER — CEFAZOLIN SODIUM-DEXTROSE 2-4 GM/100ML-% IV SOLN: 2.0000 g | INTRAVENOUS | Status: DC

## 2019-05-21 MED ORDER — FENTANYL CITRATE (PF) 250 MCG/5ML IJ SOLN
INTRAMUSCULAR | Status: DC | PRN
  Administered 2019-05-21 (×3): 50 ug via INTRAVENOUS

## 2019-05-21 MED ORDER — SODIUM CHLORIDE (PF) 0.9 % IJ SOLN
INTRAMUSCULAR | Status: AC
  Filled 2019-05-21: qty 50

## 2019-05-21 MED ORDER — SCOPOLAMINE 1 MG/3DAYS TD PT72: 1.0000 | MEDICATED_PATCH | TRANSDERMAL | Status: DC

## 2019-05-21 MED ORDER — ONDANSETRON HCL 4 MG PO TABS: 4.0000 mg | ORAL_TABLET | Freq: Four times a day (QID) | ORAL | Status: DC | PRN

## 2019-05-21 MED ORDER — KETOROLAC TROMETHAMINE 30 MG/ML IJ SOLN
INTRAMUSCULAR | Status: AC
  Filled 2019-05-21: qty 1

## 2019-05-21 MED ORDER — GENTAMICIN SULFATE 40 MG/ML IJ SOLN
5.0000 mg/kg | Freq: Once | INTRAVENOUS | Status: AC
  Administered 2019-05-21: 260 mg via INTRAVENOUS
  Filled 2019-05-21: qty 6.5

## 2019-05-21 MED ORDER — PANTOPRAZOLE SODIUM 20 MG PO TBEC
20.0000 mg | DELAYED_RELEASE_TABLET | Freq: Every day | ORAL | Status: DC
  Administered 2019-05-21 – 2019-05-22 (×2): 20 mg via ORAL
  Filled 2019-05-21 (×2): qty 1

## 2019-05-21 MED ORDER — PROPOFOL 10 MG/ML IV BOLUS
INTRAVENOUS | Status: AC
  Filled 2019-05-21: qty 20

## 2019-05-21 MED ORDER — TRIAMTERENE-HCTZ 37.5-25 MG PO TABS
1.0000 | ORAL_TABLET | Freq: Every day | ORAL | Status: DC
  Filled 2019-05-21 (×3): qty 1

## 2019-05-21 MED ORDER — HYDROMORPHONE 1 MG/ML IV SOLN
INTRAVENOUS | Status: AC
  Administered 2019-05-21: 30 mg via INTRAVENOUS
  Filled 2019-05-21: qty 30

## 2019-05-21 MED ORDER — STERILE WATER FOR IRRIGATION IR SOLN
Status: DC | PRN
  Administered 2019-05-21: 1000 mL

## 2019-05-21 MED ORDER — PROMETHAZINE HCL 25 MG/ML IJ SOLN: 6.2500 mg | INTRAMUSCULAR | Status: DC | PRN

## 2019-05-21 MED ORDER — PROPOFOL 10 MG/ML IV BOLUS
INTRAVENOUS | Status: DC | PRN
  Administered 2019-05-21: 150 mg via INTRAVENOUS

## 2019-05-21 MED ORDER — FENTANYL CITRATE (PF) 100 MCG/2ML IJ SOLN
INTRAMUSCULAR | Status: AC
  Administered 2019-05-21: 50 ug via INTRAVENOUS
  Filled 2019-05-21: qty 2

## 2019-05-21 MED ORDER — POTASSIUM CHLORIDE CRYS ER 20 MEQ PO TBCR
20.0000 meq | EXTENDED_RELEASE_TABLET | Freq: Every day | ORAL | Status: DC
  Administered 2019-05-21 – 2019-05-22 (×2): 20 meq via ORAL
  Filled 2019-05-21 (×2): qty 1

## 2019-05-21 MED ORDER — SODIUM CHLORIDE 0.9% FLUSH: 9.0000 mL | INTRAVENOUS | Status: DC | PRN

## 2019-05-21 MED ORDER — LACTATED RINGERS IV SOLN: INTRAVENOUS | Status: DC | PRN

## 2019-05-21 MED ORDER — DIPHENHYDRAMINE HCL 50 MG/ML IJ SOLN
INTRAMUSCULAR | Status: AC
  Filled 2019-05-21: qty 1

## 2019-05-21 MED ORDER — METOCLOPRAMIDE HCL 5 MG/ML IJ SOLN
INTRAMUSCULAR | Status: DC | PRN
  Administered 2019-05-21: 10 mg via INTRAVENOUS

## 2019-05-21 MED ORDER — METOCLOPRAMIDE HCL 5 MG/ML IJ SOLN
INTRAMUSCULAR | Status: AC
  Filled 2019-05-21: qty 2

## 2019-05-21 MED ORDER — FENTANYL CITRATE (PF) 250 MCG/5ML IJ SOLN
INTRAMUSCULAR | Status: AC
  Filled 2019-05-21: qty 5

## 2019-05-21 MED ORDER — LACTATED RINGERS IV SOLN: INTRAVENOUS | Status: DC

## 2019-05-21 MED ORDER — KETOROLAC TROMETHAMINE 30 MG/ML IJ SOLN
INTRAMUSCULAR | Status: DC | PRN
  Administered 2019-05-21: 30 mg via INTRAVENOUS

## 2019-05-21 MED ORDER — VASOPRESSIN 20 UNIT/ML IV SOLN
INTRAVENOUS | Status: AC
  Filled 2019-05-21: qty 1

## 2019-05-21 MED ORDER — ACETAMINOPHEN 500 MG PO TABS: 1000.0000 mg | ORAL_TABLET | Freq: Once | ORAL | Status: AC

## 2019-05-21 MED ORDER — ALPRAZOLAM 0.5 MG PO TABS: 0.5000 mg | ORAL_TABLET | Freq: Three times a day (TID) | ORAL | Status: DC | PRN

## 2019-05-21 MED ORDER — CLINDAMYCIN PHOSPHATE 900 MG/50ML IV SOLN
900.0000 mg | Freq: Once | INTRAVENOUS | Status: AC
  Administered 2019-05-21: 900 mg via INTRAVENOUS
  Filled 2019-05-21: qty 50

## 2019-05-21 MED ORDER — MENTHOL 3 MG MT LOZG: 1.0000 | LOZENGE | OROMUCOSAL | Status: DC | PRN

## 2019-05-21 MED ORDER — HYDROMORPHONE 1 MG/ML IV SOLN
INTRAVENOUS | Status: DC
  Administered 2019-05-21: 0.2 mg via INTRAVENOUS
  Administered 2019-05-22 (×2): 0 mg via INTRAVENOUS

## 2019-05-21 MED ORDER — CEFAZOLIN SODIUM-DEXTROSE 2-4 GM/100ML-% IV SOLN
INTRAVENOUS | Status: AC
  Filled 2019-05-21: qty 100

## 2019-05-21 MED ORDER — MIDAZOLAM HCL 2 MG/2ML IJ SOLN
INTRAMUSCULAR | Status: AC
  Filled 2019-05-21: qty 2

## 2019-05-21 MED ORDER — DIPHENHYDRAMINE HCL 50 MG/ML IJ SOLN: 12.5000 mg | Freq: Four times a day (QID) | INTRAMUSCULAR | Status: DC | PRN

## 2019-05-21 MED ORDER — DIPHENHYDRAMINE HCL 12.5 MG/5ML PO ELIX: 12.5000 mg | ORAL_SOLUTION | Freq: Four times a day (QID) | ORAL | Status: DC | PRN

## 2019-05-21 MED ORDER — PROPOFOL 500 MG/50ML IV EMUL
INTRAVENOUS | Status: DC | PRN
  Administered 2019-05-21: 20 ug/kg/min via INTRAVENOUS

## 2019-05-21 MED ORDER — ONDANSETRON HCL 4 MG/2ML IJ SOLN
INTRAMUSCULAR | Status: DC | PRN
  Administered 2019-05-21: 4 mg via INTRAVENOUS

## 2019-05-21 MED ORDER — ESTRADIOL 0.1 MG/GM VA CREA
TOPICAL_CREAM | VAGINAL | Status: AC
  Filled 2019-05-21: qty 42.5

## 2019-05-21 MED ORDER — LEVOTHYROXINE SODIUM 100 MCG PO TABS
200.0000 ug | ORAL_TABLET | Freq: Every day | ORAL | Status: DC
  Administered 2019-05-22: 200 ug via ORAL
  Filled 2019-05-21: qty 2

## 2019-05-21 MED ORDER — ALBUMIN HUMAN 5 % IV SOLN: INTRAVENOUS | Status: DC | PRN

## 2019-05-21 SURGICAL SUPPLY — 39 items
BLADE SURG 15 STRL LF DISP TIS (BLADE) ×2 IMPLANT
BLADE SURG 15 STRL SS (BLADE) ×3
CANISTER SUCT 3000ML PPV (MISCELLANEOUS) ×3 IMPLANT
DECANTER SPIKE VIAL GLASS SM (MISCELLANEOUS) ×2 IMPLANT
DRAPE SHEET LG 3/4 BI-LAMINATE (DRAPES) ×6 IMPLANT
DRAPE STERI URO 9X17 APER PCH (DRAPES) ×1 IMPLANT
GAUZE PACKING 1 X5 YD ST (GAUZE/BANDAGES/DRESSINGS) ×1 IMPLANT
GAUZE PACKING 2X5 YD STRL (GAUZE/BANDAGES/DRESSINGS) ×3 IMPLANT
GAUZE SPONGE 4X4 16PLY XRAY LF (GAUZE/BANDAGES/DRESSINGS) ×2 IMPLANT
GLOVE BIO SURGEON STRL SZ7.5 (GLOVE) ×4 IMPLANT
GLOVE BIOGEL PI IND STRL 6.5 (GLOVE) IMPLANT
GLOVE BIOGEL PI IND STRL 7.0 (GLOVE) ×4 IMPLANT
GLOVE BIOGEL PI IND STRL 7.5 (GLOVE) ×2 IMPLANT
GLOVE BIOGEL PI INDICATOR 6.5 (GLOVE) ×1
GLOVE BIOGEL PI INDICATOR 7.0 (GLOVE) ×2
GLOVE BIOGEL PI INDICATOR 7.5 (GLOVE) ×1
GLOVE NEODERM STER SZ 7 ×1 IMPLANT
GLOVE NEODERM STER SZ 7 (GLOVE) IMPLANT
GLOVE SKINSENSE NS SZ6.5 (GLOVE) ×1
GLOVE SKINSENSE STRL SZ6.5 (GLOVE) IMPLANT
GOWN STRL REUS W/ TWL LRG LVL3 (GOWN DISPOSABLE) ×8 IMPLANT
GOWN STRL REUS W/TWL LRG LVL3 (GOWN DISPOSABLE) ×12
KIT TURNOVER KIT B (KITS) ×3 IMPLANT
NEEDLE HYPO 22GX1.5 SAFETY (NEEDLE) ×5 IMPLANT
NS IRRIG 1000ML POUR BTL (IV SOLUTION) ×3 IMPLANT
PACK VAGINAL WOMENS (CUSTOM PROCEDURE TRAY) ×3 IMPLANT
SET CYSTO W/LG BORE CLAMP LF (SET/KITS/TRAYS/PACK) ×3 IMPLANT
SUT VIC AB 0 CT1 18XCR BRD8 (SUTURE) IMPLANT
SUT VIC AB 0 CT1 8-18 (SUTURE)
SUT VIC AB 1 CT1 36 (SUTURE) IMPLANT
SUT VIC AB 2-0 CT1 27 (SUTURE) ×24
SUT VIC AB 2-0 CT1 TAPERPNT 27 (SUTURE) ×8 IMPLANT
SUT VIC AB 2-0 SH 27 (SUTURE) ×18
SUT VIC AB 2-0 SH 27XBRD (SUTURE) ×8 IMPLANT
SUT VIC AB 3-0 SH 27 (SUTURE)
SUT VIC AB 3-0 SH 27X BRD (SUTURE) IMPLANT
TOWEL GREEN STERILE FF (TOWEL DISPOSABLE) ×6 IMPLANT
TRAY FOLEY W/BAG SLVR 16FR (SET/KITS/TRAYS/PACK) ×3
TRAY FOLEY W/BAG SLVR 16FR ST (SET/KITS/TRAYS/PACK) ×2 IMPLANT

## 2019-05-21 NOTE — Anesthesia Procedure Notes (Signed)
Procedure Name: LMA Insertion Date/Time: 05/21/2019 1:10 PM Performed by: Kathryne Hitch, CRNA Pre-anesthesia Checklist: Patient identified, Emergency Drugs available, Suction available and Patient being monitored Patient Re-evaluated:Patient Re-evaluated prior to induction Oxygen Delivery Method: Circle system utilized Preoxygenation: Pre-oxygenation with 100% oxygen Induction Type: IV induction Ventilation: Mask ventilation without difficulty LMA: LMA inserted LMA Size: 4.0 Placement Confirmation: breath sounds checked- equal and bilateral Tube secured with: Tape

## 2019-05-21 NOTE — Anesthesia Preprocedure Evaluation (Addendum)
Anesthesia Evaluation  Patient identified by MRN, date of birth, ID band Patient awake  General Assessment Comment:Last episode of PONV in 1970s, has not had GA since then  Reviewed: Allergy & Precautions, H&P , NPO status , Patient's Chart, lab work & pertinent test results  History of Anesthesia Complications (+) PONV and history of anesthetic complications  Airway Mallampati: I  TM Distance: >3 FB Neck ROM: Full    Dental  (+) Dental Advisory Given, Poor Dentition, Missing,    Pulmonary neg pulmonary ROS,    Pulmonary exam normal        Cardiovascular hypertension, Pt. on medications Normal cardiovascular exam Rhythm:Regular Rate:Normal     Neuro/Psych PSYCHIATRIC DISORDERS Anxiety Depression negative neurological ROS     GI/Hepatic Neg liver ROS, hiatal hernia, GERD  Medicated and Controlled,Rectocele   GERD well controlled with prevacid BID    Endo/Other  Hypothyroidism   Renal/GU negative Renal ROS  negative genitourinary   Musculoskeletal  (+) Arthritis , Osteoarthritis,    Abdominal Normal abdominal exam  (+)   Peds negative pediatric ROS (+)  Hematology negative hematology ROS (+)   Anesthesia Other Findings   Reproductive/Obstetrics negative OB ROS                           Anesthesia Physical  Anesthesia Plan  ASA: II  Anesthesia Plan: General   Post-op Pain Management:    Induction: Intravenous  PONV Risk Score and Plan: 4 or greater and Ondansetron, Dexamethasone, Midazolam, Scopolamine patch - Pre-op, Propofol infusion, Diphenhydramine and Metaclopromide  Airway Management Planned: LMA  Additional Equipment: None  Intra-op Plan:   Post-operative Plan: Extubation in OR  Informed Consent: I have reviewed the patients History and Physical, chart, labs and discussed the procedure including the risks, benefits and alternatives for the proposed anesthesia with the  patient or authorized representative who has indicated his/her understanding and acceptance.     Dental advisory given  Plan Discussed with: CRNA  Anesthesia Plan Comments:        Anesthesia Quick Evaluation

## 2019-05-21 NOTE — Transfer of Care (Signed)
Immediate Anesthesia Transfer of Care Note  Patient: Carolyn [name]  Procedure(s) Performed: ANTERIOR (CYSTOCELE) AND POSTERIOR REPAIR (RECTOCELE) (N/A Vagina ) CYSTOSCOPY (Bilateral Urethra)  Patient Location: PACU  Anesthesia Type:General  Level of Consciousness: drowsy and patient cooperative  Airway & Oxygen Therapy: Patient Spontanous Breathing and Patient connected to face mask oxygen  Post-op Assessment: Report given to RN and Post -op Vital signs reviewed and stable  Post vital signs: Reviewed and stable  Last Vitals:  Vitals Value Taken Time  BP 125/65 05/21/19 1624  Temp    Pulse 114 05/21/19 1624  Resp 16 05/21/19 1624  SpO2 100 % 05/21/19 1624  Vitals shown include unvalidated device data.  Last Pain:  Vitals:   05/21/19 0934  TempSrc:   PainSc: 0-No pain         Complications: No apparent anesthesia complications

## 2019-05-22 DIAGNOSIS — N993 Prolapse of vaginal vault after hysterectomy: Secondary | ICD-10-CM | POA: Diagnosis not present

## 2019-05-22 LAB — BASIC METABOLIC PANEL
Anion gap: 10 (ref 5–15)
BUN: 24 mg/dL — ABNORMAL HIGH (ref 8–23)
CO2: 30 mmol/L (ref 22–32)
Calcium: 8.9 mg/dL (ref 8.9–10.3)
Chloride: 100 mmol/L (ref 98–111)
Creatinine, Ser: 1.03 mg/dL — ABNORMAL HIGH (ref 0.44–1.00)
GFR calc Af Amer: >60 mL/min (ref >60)
GFR calc non Af Amer: 57 mL/min — ABNORMAL LOW (ref >60)
Glucose, Bld: 133 mg/dL — ABNORMAL HIGH (ref 70–99)
Potassium: 3.1 mmol/L — ABNORMAL LOW (ref 3.5–5.1)
Sodium: 140 mmol/L (ref 135–145)

## 2019-05-22 LAB — CBC
HCT: 29.9 % — ABNORMAL LOW (ref 36.0–46.0)
Hemoglobin: 9.9 g/dL — ABNORMAL LOW (ref 12.0–15.0)
MCH: 29.8 pg (ref 26.0–34.0)
MCHC: 33.1 g/dL (ref 30.0–36.0)
MCV: 90.1 fL (ref 80.0–100.0)
Platelets: 235 10*3/uL (ref 150–400)
RBC: 3.32 MIL/uL — ABNORMAL LOW (ref 3.87–5.11)
RDW: 12.1 % (ref 11.5–15.5)
WBC: 13.1 10*3/uL — ABNORMAL HIGH (ref 4.0–10.5)
nRBC: 0 % (ref 0.0–0.2)

## 2019-05-22 MED ORDER — ACETAMINOPHEN 500 MG PO TABS: ORAL_TABLET | ORAL | 0 refills | Status: AC

## 2019-05-22 MED ORDER — ESTRADIOL 0.1 MG/GM VA CREA: TOPICAL_CREAM | VAGINAL | 1 refills | Status: AC

## 2019-05-22 MED ORDER — IBUPROFEN 600 MG PO TABS: ORAL_TABLET | ORAL | 1 refills | Status: AC

## 2019-05-22 MED ORDER — OXYCODONE HCL 5 MG PO TABS: ORAL_TABLET | ORAL | 0 refills | Status: AC

## 2019-05-22 NOTE — Progress Notes (Addendum)
Carolyn [name] is a69 y.o.  [id number]  Post Op Date #1: Anterior-Posterior Colporrhaphy and Cystoscopy  Subjective: Patient is Doing well postoperatively. Patient has The patient is not having any pain.Pain is controlled with reduced dose PCA Dilaudid and Ketorolac.  She has tolerated crackers and liquids-ordering a regular breakfast; ambulating in the halls and voiding  without difficulty.   Objective: Vital signs in last 24 hours: Temp:  [97 F (36.1 C)-98.9 F (37.2 C)] 98.9 F (37.2 C) (03/04 0553) Pulse Rate:  [92-121] 92 (03/04 0553) Resp:  [12-18] 16 (03/04 0553) BP: (99-132)/(49-75) 102/57 (03/04 0553) SpO2:  [92 %-100 %] 100 % (03/04 0553) Weight:  [61.7 kg] 61.7 kg (03/03 0913)  Intake/Output from previous day: 03/03 0701 - 03/04 0700 In: 4110 [P.O.:360; I.V.:3250] Out: 725 [Urine:625] Intake/Output this shift: No intake/output data recorded. Recent Labs  Lab 05/19/19 1500 05/22/19 0310  WBC 8.2 13.1*  HGB 13.3 9.9*  HCT 39.3 29.9*  PLT 328 235     Recent Labs  Lab 05/19/19 1500 05/22/19 0310  NA 138 140  K 3.6 3.1*  CL 99 100  CO2 29 30  BUN 19 24*  CREATININE 0.89 1.03*  CALCIUM 9.6 8.9  GLUCOSE 125* 133*    EXAM: General: alert, cooperative and no distress Resp: clear to auscultation bilaterally Cardio: regular rate and rhythm, S1, S2 normal, no murmur, click, rub or gallop GI: bowel sounds present; abdomen is soft and non-tender Extremities: Homans sign is negative, no sign of DVT and no calf tenderness. Vaginal Bleeding: none-dried faint blood stain the length of pad   Assessment: s/p Procedure(s): ANTERIOR (CYSTOCELE) AND POSTERIOR REPAIR (RECTOCELE) CYSTOSCOPY: stable, progressing well, tolerating diet and anemia  Plan: Advance diet Advance to PO medication Discharge home  Advised patient of recommended Ibuprofen 600 mg with Acetaminophen 500 mg  #2 every 6 hours for 5 days for pain management.  She will be given some narcotic  pain medication for breakthrough pain however she states that she will probably not take it because it makes her feel bad.   LOS: 0 days    Earnstine Regal, PA-C 05/22/2019 7:49 AM

## 2019-05-22 NOTE — Anesthesia Postprocedure Evaluation (Signed)
Anesthesia Post Note  Patient: Carolyn [name]  Procedure(s) Performed: ANTERIOR (CYSTOCELE) AND POSTERIOR REPAIR (RECTOCELE) (N/A Vagina ) CYSTOSCOPY (Bilateral Urethra)     Patient location during evaluation: Other Anesthesia Type: General Level of consciousness: awake and alert Pain management: pain level controlled Vital Signs Assessment: post-procedure vital signs reviewed and stable Respiratory status: spontaneous breathing, nonlabored ventilation and respiratory function stable Cardiovascular status: blood pressure returned to baseline and stable Postop Assessment: no apparent nausea or vomiting Anesthetic complications: no    Last Vitals:  Vitals:   05/22/19 0511 05/22/19 0553  BP:  (!) 102/57  Pulse:  92  Resp: 18 16  Temp:  37.2 C  SpO2: 100% 100%    Last Pain:  Vitals:   05/22/19 0939  TempSrc:   PainSc: 3                  Rhian Asebedo,W. EDMOND

## 2019-05-22 NOTE — Discharge Summary (Signed)
Physician Discharge Summary  Patient ID: Carolyn [name] MRN: [id number] DOB/AGE: 1955/09/12 64 y.o.  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  Post operative course was unremarkable with the patient resuming bowel and bladder function by post operative day #1 and was therefore deemed ready for discharge home.  Discharge hemoglobin was 9.9.  Disposition: Discharge disposition: 01-Home or Self Care       Discharge Medications:  Allergies as of 05/22/2019      Reactions   Hydrocodone-acetaminophen Nausea And Vomiting   Nsaids Nausea Only   Patient states she may take naproxen and ibuprofen   Other    Anesthesia makes her vomit   Oxycodone Nausea And Vomiting   Penicillins Nausea Only      Medication List    TAKE these medications   acetaminophen 500 MG tablet Commonly known as: TYLENOL take #2 tablets every 6 hours for 5 days then prn-post operative pain What changed:   how much to take  how to take this  when to take this  reasons to take this  additional instructions   ALPRAZolam 0.5 MG tablet Commonly known as: XANAX Take 0.5 mg by mouth 3 (three) times daily as needed for anxiety.   estradiol 0.1 MG/GM vaginal cream Commonly known as: ESTRACE place 1 gram of  vaginal cream on finger and place inside of vagina every other day starting May 30, 2019 x 6 weeks   fenofibrate 160 MG tablet Take 160 mg by mouth daily.   ibuprofen 600 MG tablet Commonly known as: ADVIL take 1 tablet po pc every 6 hours for 5 days the prn-post operative pain   levothyroxine 200 MCG tablet Commonly known as:  SYNTHROID Take 200 mcg by mouth daily before breakfast.   oxyCODONE 5 MG immediate release tablet Commonly known as: Roxicodone take 1 tablet po every 6 hours prn for breakthrough post operative pain   potassium chloride SA 20 MEQ tablet Commonly known as: KLOR-CON Take 20 mEq by mouth daily.   Prevacid 15 MG capsule Generic drug: lansoprazole Take 15 mg by mouth daily before breakfast.   torsemide 20 MG tablet Commonly known as: DEMADEX Take 20 mg by mouth daily at 6 PM.   triamterene-hydrochlorothiazide 37.5-25 MG tablet Commonly known as: MAXZIDE-25 Take 1 tablet by mouth daily.       Follow-up: Dr. Mancel Bale on June 04 2019 at 9:30 a.m.    Signed: Earnstine Regal, PA-C 05/22/2019, 8:11 AM

## 2019-05-22 NOTE — Progress Notes (Addendum)
Removed pt's vaginal packing, with moderate saturation of sanguinous drainage. pt tolerated well. Removed foley catheter as ordered. Will monitor pt.

## 2019-05-22 NOTE — Discharge Instructions (Signed)
Call [facility] @ [phone] if:  You have a temperature greater than or equal to 100.4 degrees Farenheit orally You have pain that is not made better by the pain medication given and taken as directed You have excessive bleeding or problems urinating  Take Colace (Docusate Sodium/Stool Softener) 100 mg 2-3 times daily while taking narcotic pain medicine to avoid constipation or until bowel movements are regular. Take with food,  Ibuprofen 600 mg every 6 hours along with Tylenol  #2 500 mg every 6 hours for 5 days then as needed for pain Place Estrace (estradiol) Vaginal cream (1 gram) in vagina with finger every other day for 6 weeks starting May 30, 2019.   >>>Call the office to schedule a lab only visit next week to recheck your basic metabolic panel<<<   You may drive after 1 week You may walk up steps  You may shower  You may resume a regular diet  Do not lift over 15 pounds for 6 weeks Avoid anything in vagina for 6 weeks

## 2019-05-22 NOTE — Progress Notes (Signed)
Received pt alert and oriented x 4. Pt denies pain at this time. Oriented pt to room and plan of care. Will monitor pt.

## 2019-05-22 NOTE — Progress Notes (Signed)
Pt ambulated around the hall, tolerated well. Pt now on room air.

## 2019-05-26 ENCOUNTER — Encounter: Payer: Self-pay | Admitting: *Deleted

## 2019-05-26 NOTE — Op Note (Signed)
Preop Diagnosis: Cystocele, Rectocele and Enterocele  Postop Diagnosis: Cystocele, Rectocele and Enterocele  Procedure: 1.ANTERIOR AND POSTERIOR REPAIR 2.CYSTOSCOPY  Anesthesia: General   Attending: Everett Graff, MD   Assistant: Earnstine Regal, PA-C  Findings: Cystocele, rectocele and enterocele  Pathology: N/a  Fluids: 2000 cc Albumin 500 cc  UOP: 100 cc  EBL: [id number] cc  Complications: None  Procedure: The patient was taken to the operating room after the risks, benefits and alternatives were discussed with the patient, the patient verbalized understanding and consent signed and witnessed. A timeout was performed per protocol. The patient was prepped and draped in the normal sterile fashion in the dorsal lithotomy position and the patient was placed under general anesthesia per the anesthesiologist. A weighted speculum was placed in the patient's vagina and the anterior vaginal wall was retracted using Deaver retractors. The anterior vaginal wall was injected with dilute pitressin (20u in 50 cc NS) and the anterior vaginal wall was then incised and dissected away from the underlying layer of tissue. The cystocele was repaired with plication stitches. The excess vaginal wall tissue was excised and repaired with a running interlocking stitch of 2-0 Vicryl. Attention was then turned to the posterior vaginal wall where dilute pitressin was injected. The posterior vaginal wall was incised and the underlying tissue was dissected away from the posterior vaginal wall. The rectocele was repaired using plication stitches of 2-0 Vicryl. Excess posterior vaginal wall tissue was excised and the posterior vaginal wall repaired with 2-0 vicryl via a running interlocking stitch. The perineum was repaired with 2-0 Vicryl via a subcuticular stitch. Cystoscopy was performed and bilateral ureters were noted to efflux without difficulty and there were no inadvertent bladder injuries noted. The  vagina was packed with estrogen-soaked packing. The patient tolerated the procedure well and was awaiting return to the recovery room in good condition.

## 2020-08-07 IMAGING — US US PELVIS COMPLETE WITH TRANSVAGINAL
1 series · 13 of 25 positions shown · non-contrast
Comparison: None

CLINICAL DATA: Female genital prolapse, pelvic pressure, prior
abdominal hysterectomy, new rectal prolapse on physical exam



[Series 1: us pelvis complete with transvaginal · 0.26mm/px · 13 of 26 slices shown]
[im 1/26]
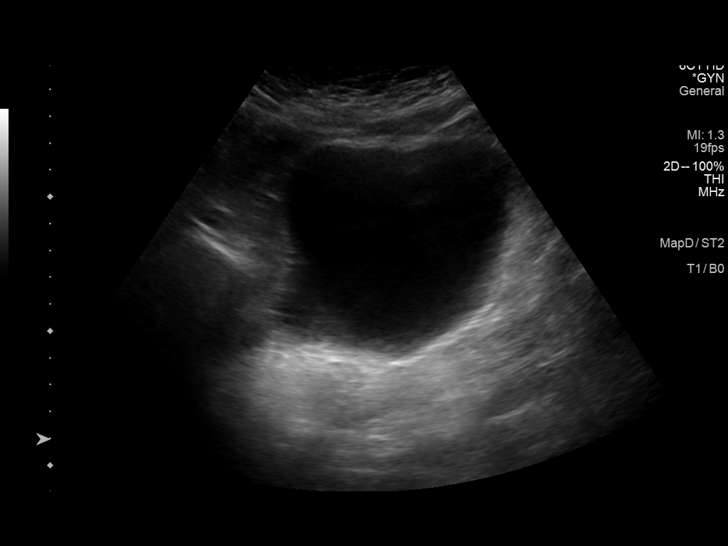
[im 3/26]
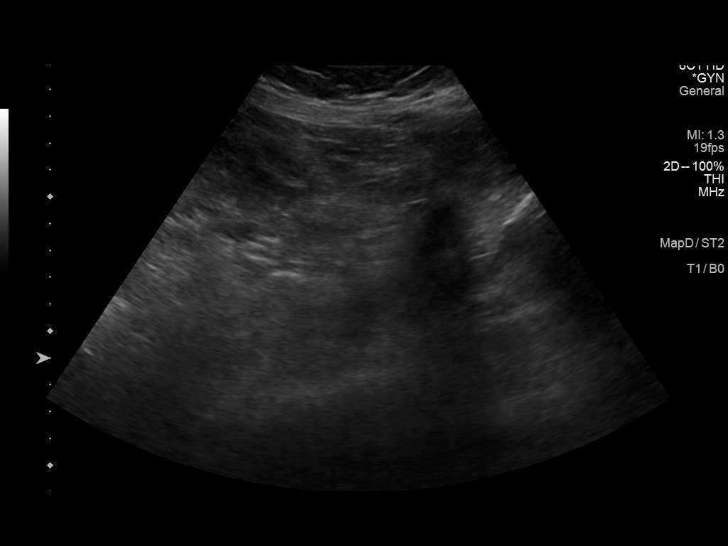
[im 5/26]
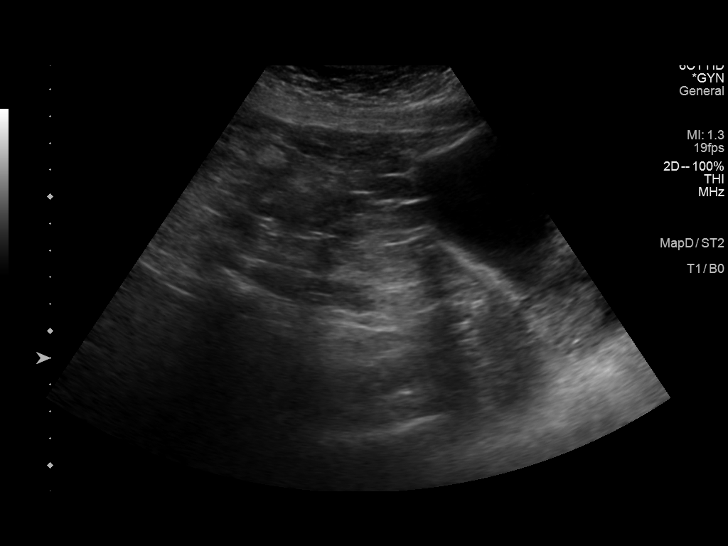
[im 7/26]
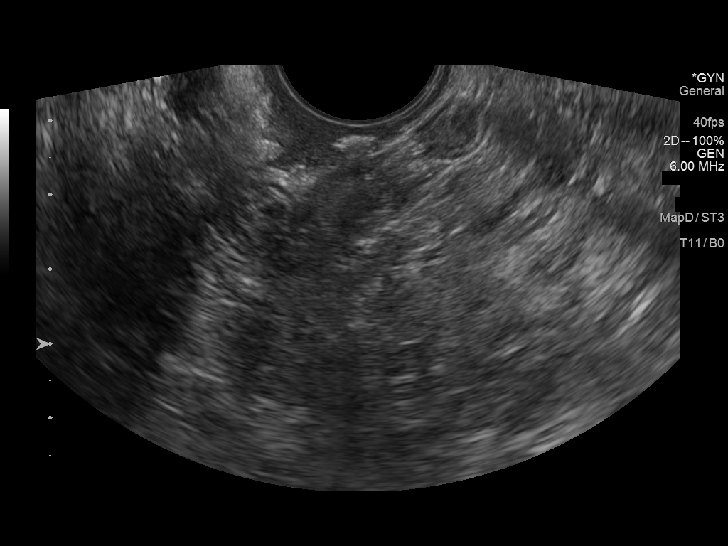
[im 9/26]
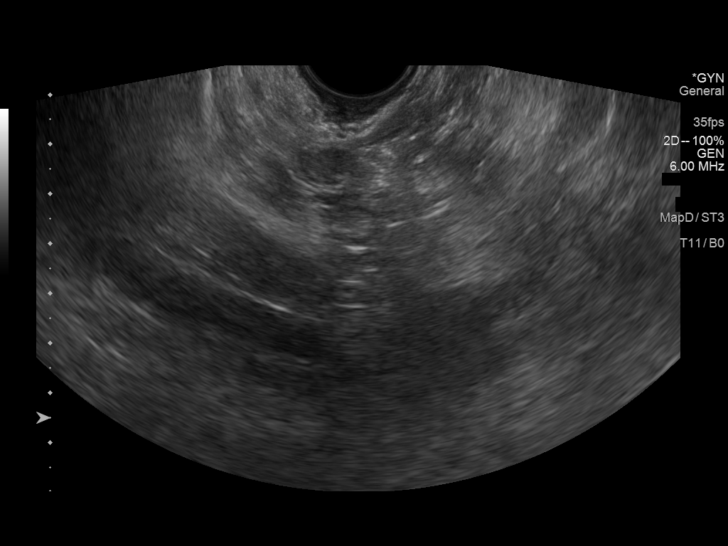
[im 11/26]
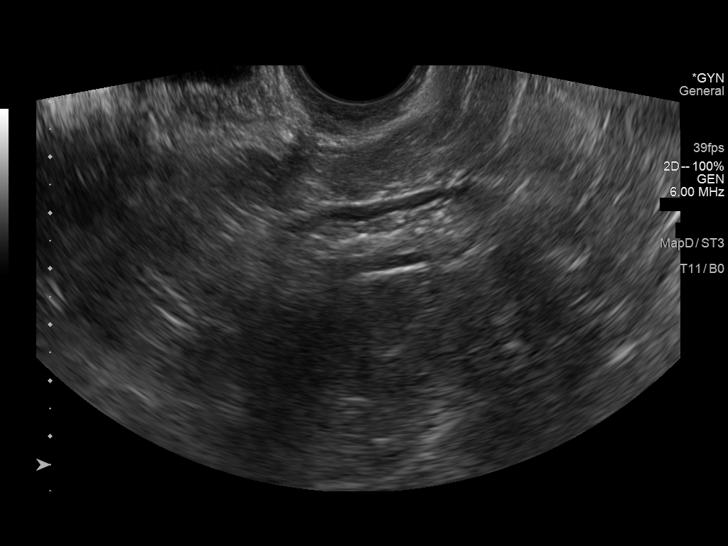
[im 13/26]
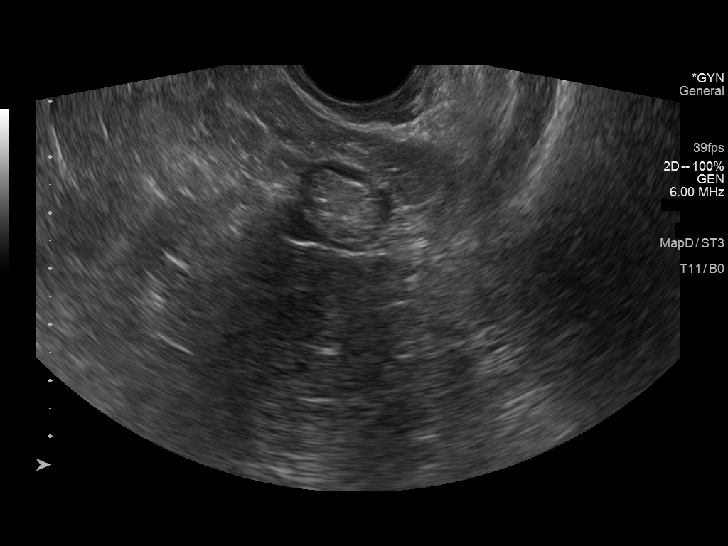
[im 15/26]
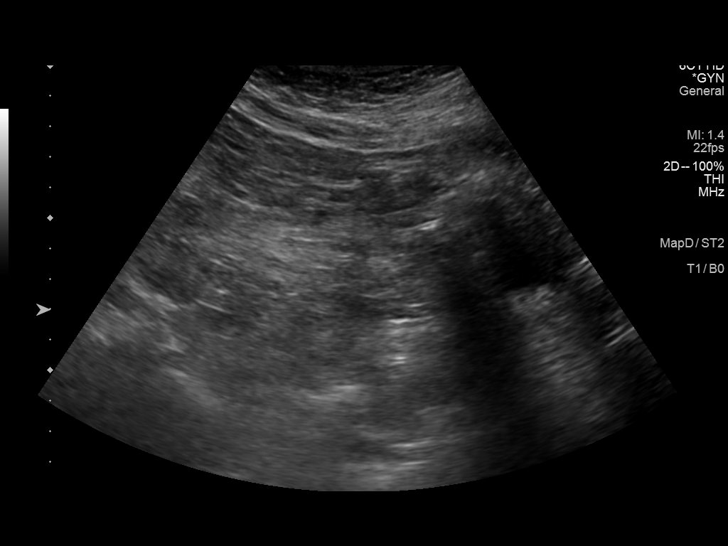
[im 17/26]
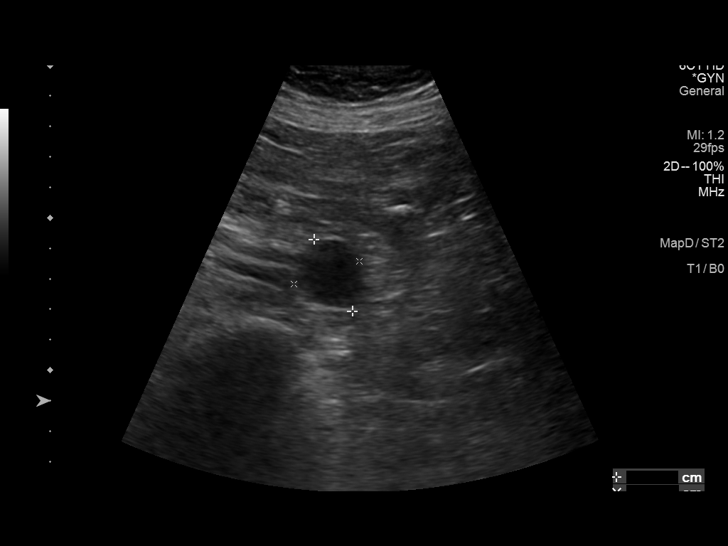
[im 19/26]
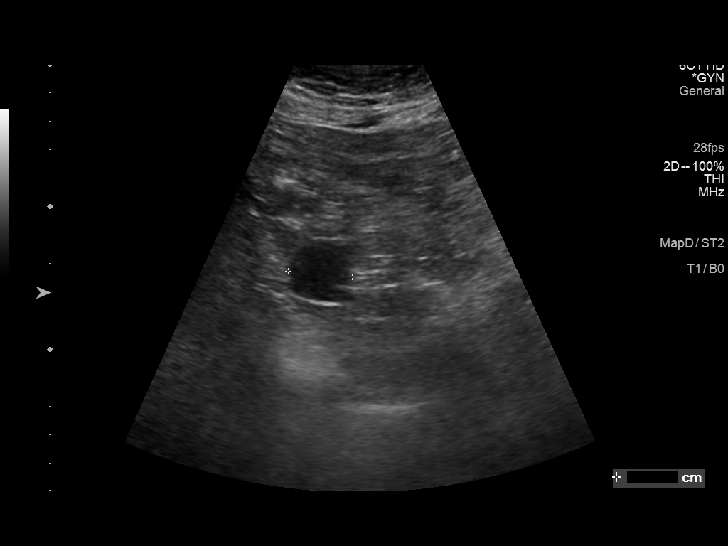
[im 21/26]
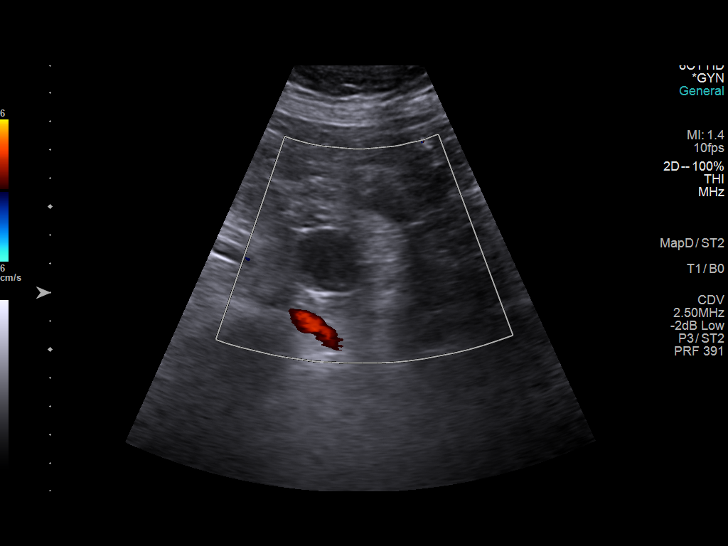
[im 23/26]
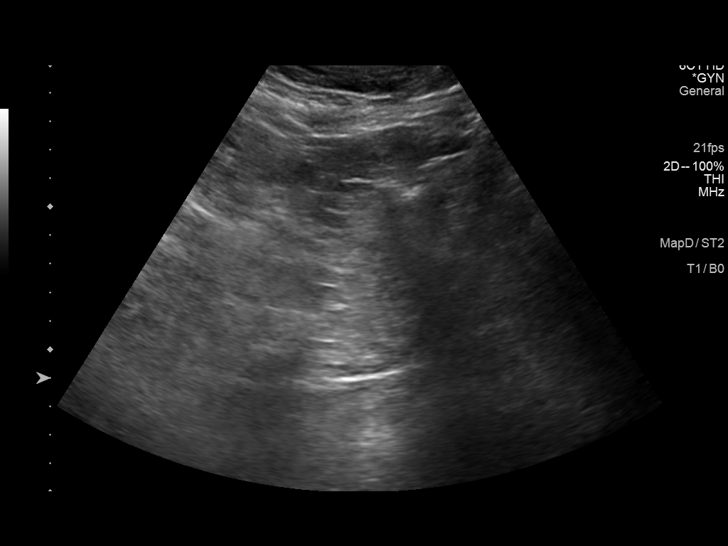
[im 26/26]
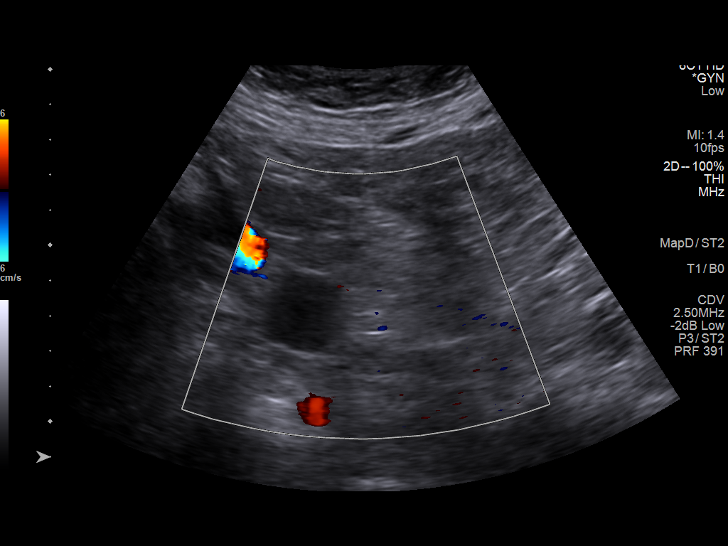

[13 of 25 positions shown; findings below may reference images not displayed]

FINDINGS: Uterus

Surgically absent

Endometrium

N/A

Right ovary

No normal appearing RIGHT ovary identified, see below

Left ovary

Not visualized, question surgically absent versus obscured by bowel

Other findings

In RIGHT adnexa, a small hypoechoic nodule likely a complicated cyst
is identified 2.7 x 2.3 x 2.3 cm, seen on transabdominal imaging but
not visualized on transvaginal imaging. No septations or mural
nodularity. This could represent a RIGHT ovarian or adnexal cyst.
This is of uncertain chronicity.
IMPRESSION: Post hysterectomy with nonvisualization of LEFT ovary.

2.7 x 2.3 x 2.3 cm diameter cystic lesion in RIGHT adnexa containing
minimal scattered internal echogenicity. This could represent a
minimally complicated cyst of RIGHT ovarian or paraovarian origin,
with no additional RIGHT ovarian tissue identified.

This is likely benign but since this is mildly complicated by a
scattered internal echoes and of uncertain chronicity, follow-up
ultrasound is recommended in 6 months to demonstrate stability.

## 2021-11-30 ENCOUNTER — Ambulatory Visit
Admission: RE | Admit: 2021-11-30 | Discharge: 2021-11-30 | Disposition: A | Discharge status: Home / Self Care | Payer: Medicare Other | Source: Ambulatory Visit | Attending: Endocrinology | Admitting: Endocrinology

## 2021-11-30 ENCOUNTER — Other Ambulatory Visit: Payer: Self-pay | Admitting: Endocrinology

## 2021-11-30 ENCOUNTER — Other Ambulatory Visit: Payer: Self-pay | Admitting: Registered Nurse

## 2021-11-30 DIAGNOSIS — R932 Abnormal findings on diagnostic imaging of liver and biliary tract: Secondary | ICD-10-CM

## 2021-11-30 DIAGNOSIS — R1032 Left lower quadrant pain: Secondary | ICD-10-CM

## 2021-11-30 MED ORDER — IOPAMIDOL (ISOVUE-300) INJECTION 61%
100.0000 mL | Freq: Once | INTRAVENOUS | Status: AC | PRN
  Administered 2021-11-30: 100 mL via INTRAVENOUS

## 2021-12-01 ENCOUNTER — Ambulatory Visit
Admission: RE | Admit: 2021-12-01 | Discharge: 2021-12-01 | Disposition: A | Discharge status: Home / Self Care | Payer: Medicare Other | Source: Ambulatory Visit | Attending: Endocrinology | Admitting: Endocrinology

## 2021-12-01 DIAGNOSIS — R932 Abnormal findings on diagnostic imaging of liver and biliary tract: Secondary | ICD-10-CM

## 2021-12-01 MED ORDER — GADOBENATE DIMEGLUMINE 529 MG/ML IV SOLN: 13.0000 mL | Freq: Once | INTRAVENOUS | Status: DC | PRN

## 2021-12-02 ENCOUNTER — Other Ambulatory Visit: Payer: Self-pay

## 2021-12-02 ENCOUNTER — Telehealth: Payer: Self-pay

## 2021-12-02 DIAGNOSIS — K8689 Other specified diseases of pancreas: Secondary | ICD-10-CM

## 2021-12-02 NOTE — Telephone Encounter (Signed)
EUS scheduled, pt instructed and medications reviewed.  Patient instructions mailed to home.  Patient to call with any questions or concerns.  

## 2021-12-02 NOTE — Telephone Encounter (Signed)
The pt has been scheduled for EUS on 12/12/21 at 1245 pm at [facility] with GM   Left message on machine to call back

## 2021-12-02 NOTE — Telephone Encounter (Signed)
-----   Message from Irving Copas., MD sent at 12/02/2021  1:05 PM EDT ----- FB, No worries we will get her in soon.  Markail Diekman, This is the patient I spoke with you on the phone about briefly.  She has a pancreas tail mass that needs to be biopsied to rule in/out cancer.  I spoke with endoscopy staff and can add her on 9/25 to my day.  Please move forward with scheduling her for that day. Thanks. GM ----- Message ----- From: Stark Klein, MD Sent: 12/02/2021  12:17 PM EDT To: Irving Copas., MD  Gabe, this lady needs an EUS ASAP :).  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  I know you are backed up.  Do you want me to check with Dr. Benson Norway?  She has not had a procedure in epic since 2010, but it was Dr. Olevia Perches who was [place]. It is a T2 by CT so would do chemo first.    Tx FB

## 2021-12-02 NOTE — Progress Notes (Signed)
We received an in basket message from Dr Barry Dienes requesting urgent consultation.  Appt was made: 12/09/2021 at 1330 with Cassie Heilingoetter, PA-C and Dr Burr Medico made.  Pt aware.

## 2021-12-05 ENCOUNTER — Other Ambulatory Visit: Payer: Self-pay | Admitting: General Surgery

## 2021-12-05 DIAGNOSIS — C252 Malignant neoplasm of tail of pancreas: Secondary | ICD-10-CM

## 2021-12-06 ENCOUNTER — Encounter (HOSPITAL_COMMUNITY): Payer: Self-pay | Admitting: Gastroenterology

## 2021-12-06 NOTE — Progress Notes (Signed)
I left a vm for Carolyn [name] to call me back.

## 2021-12-07 NOTE — Progress Notes (Signed)
I called Carolyn [name] to reschedule her appt with medical oncology until after her EUS with Dr Rush Landmark.  She states she is going to [facility] for treatment.  Her appt has been cancelled.

## 2021-12-09 ENCOUNTER — Inpatient Hospital Stay: Payer: Medicare Other | Admitting: Physician Assistant

## 2021-12-12 ENCOUNTER — Ambulatory Visit (HOSPITAL_COMMUNITY): Admission: RE | Admit: 2021-12-12 | Payer: Medicare Other | Source: Home / Self Care | Admitting: Gastroenterology

## 2021-12-12 ENCOUNTER — Encounter (HOSPITAL_COMMUNITY): Admission: RE | Payer: Self-pay | Source: Home / Self Care

## 2021-12-12 SURGERY — UPPER ESOPHAGEAL ENDOSCOPIC ULTRASOUND (EUS)
Anesthesia: Monitor Anesthesia Care

## 2022-02-14 ENCOUNTER — Emergency Department (HOSPITAL_COMMUNITY)
Admission: EM | Admit: 2022-02-14 | Discharge: 2022-02-15 | Disposition: A | Discharge status: Home / Self Care | Payer: Medicare Other | Attending: Emergency Medicine | Admitting: Emergency Medicine

## 2022-02-14 ENCOUNTER — Emergency Department (HOSPITAL_COMMUNITY): Payer: Medicare Other

## 2022-02-14 ENCOUNTER — Other Ambulatory Visit: Payer: Self-pay

## 2022-02-14 DIAGNOSIS — T7840XA Allergy, unspecified, initial encounter: Secondary | ICD-10-CM

## 2022-02-14 DIAGNOSIS — R253 Fasciculation: Secondary | ICD-10-CM | POA: Diagnosis present

## 2022-02-14 DIAGNOSIS — T451X5A Adverse effect of antineoplastic and immunosuppressive drugs, initial encounter: Secondary | ICD-10-CM | POA: Insufficient documentation

## 2022-02-14 MED ORDER — SODIUM CHLORIDE 0.9 % IV BOLUS: 1000.0000 mL | Freq: Once | INTRAVENOUS | Status: DC

## 2022-02-14 MED ORDER — DIPHENHYDRAMINE HCL 50 MG/ML IJ SOLN
25.0000 mg | Freq: Once | INTRAMUSCULAR | Status: AC
  Administered 2022-02-15: 25 mg via INTRAVENOUS
  Filled 2022-02-14: qty 1

## 2022-02-14 NOTE — ED Triage Notes (Signed)
Patient coming to ED for evaluation of possible allergic reaction to new chemo drugs.  States she started chemo today and had a reaction during infusion.  Reaction was to Irinotecan.  Given Ativan for reaction and had another reaction.  Pt reports she was given Benadryl and symptoms resolved.  At home tonight, patient had a pump that was infusing Fluorouracil and felt like she was having additional reactions.  Stopped pump and came to ED.  C/o increased HR, lip swelling, and throat itching.  Pt currently taking Irinotecan, Oxaliplatin, Leucovorin, and Fluorouracil.

## 2022-02-14 NOTE — ED Provider Triage Note (Signed)
Emergency Medicine Provider Triage Evaluation Note  Carolyn [name] , a 66 y.o. female  was evaluated in triage.  Pt complains of itching of her eyes, sensation of swelling of her eyes and mouth and chest tightness since taking her fluorouracil for pancreatic cancer. Reports she was given another chemo drug earlier today and had a bad reaction as well---was given ativan and had panic attack.   Review of Systems  Positive: Swelling of eyes, itching, chest tightness Negative: SOB  Physical Exam  BP (!) 148/79 (BP Location: Left Arm)   Pulse 99   Temp 98 F (36.7 C)   Resp 18   SpO2 99%  Gen:   Awake, no distress   Resp:  Normal effort  MSK:   Moves extremities without difficulty  Other:  No appreciable facial swelling  Medical Decision Making  Medically screening exam initiated at 10:55 PM.  Appropriate orders placed.  Carolyn [name] was informed that the remainder of the evaluation will be completed by another provider, this initial triage assessment does not replace that evaluation, and the importance of remaining in the ED until their evaluation is complete.     Carolyn [name], [place] 02/14/22 2311

## 2022-02-14 NOTE — ED Notes (Signed)
Pt wants to wait for IV for labs. RN made aware.

## 2022-02-15 DIAGNOSIS — R253 Fasciculation: Secondary | ICD-10-CM | POA: Diagnosis not present

## 2022-02-15 MED ORDER — HEPARIN SOD (PORK) LOCK FLUSH 100 UNIT/ML IV SOLN
INTRAVENOUS | Status: AC
  Filled 2022-02-15: qty 5

## 2022-02-15 MED ORDER — HEPARIN SOD (PORK) LOCK FLUSH 100 UNIT/ML IV SOLN: 500.0000 [IU] | Freq: Once | INTRAVENOUS | Status: DC

## 2022-02-15 MED ORDER — DIPHENHYDRAMINE HCL 25 MG PO CAPS
25.0000 mg | ORAL_CAPSULE | Freq: Once | ORAL | Status: AC
  Administered 2022-02-15: 25 mg via ORAL
  Filled 2022-02-15: qty 1

## 2022-02-15 MED ORDER — METHYLPREDNISOLONE 4 MG PO TBPK: ORAL_TABLET | ORAL | 0 refills | Status: AC

## 2022-02-15 NOTE — ED Notes (Signed)
Pt request room temp water. Room temp water given

## 2022-02-15 NOTE — Discharge Instructions (Addendum)
Take a 25 mg Benadryl tablet every 6 hours for the next 2 days.  Discussed further treatment options with Dr. Emeterio Reeve.

## 2022-02-15 NOTE — ED Provider Notes (Signed)
[facility] DEPT Provider Note   CSN: [id number] Arrival date & time: 02/14/22  2230     History  Chief Complaint  Patient presents with   Allergic Reaction    Carolyn [name] is a 66 y.o. female.  Patient presents to the emergency department with concerns over allergic reaction.  Patient reports that she had IV chemotherapy at [facility] earlier today.  During the treatment she had possible allergic reaction with symptoms of facial twitching, throat pain, rapid heart rate.  She was treated with Solu-Medrol and Benadryl.  Patient went home on a new infusion that was to be infused continuously for 48 hours.  After getting home she had recurrence of some of the symptoms.  She was told to stop the infusion and go to the ER.  While in the waiting room her symptoms have resolved, she feels like she is back to her normal baseline.       Home Medications Prior to Admission medications   Medication Sig Start Date End Date Taking? Authorizing Provider  methylPREDNISolone (MEDROL DOSEPAK) 4 MG TBPK tablet As directed 02/15/22  Yes Anatasia Tino, Gwenyth Allegra, MD  acetaminophen (TYLENOL) 500 MG tablet take #2 tablets every 6 hours for 5 days then prn-post operative pain 05/22/19   Earnstine Regal, PA-C  ALPRAZolam Duanne Moron) 0.5 MG tablet Take 0.5 mg by mouth 3 (three) times daily as needed for anxiety.    [provider]  estradiol (ESTRACE) 0.1 MG/GM vaginal cream place 1 gram of  vaginal cream on finger and place inside of vagina every other day starting May 30, 2019 x 6 weeks 05/22/19   Earnstine Regal, PA-C  fenofibrate 160 MG tablet Take 160 mg by mouth daily.    [provider]  ibuprofen (ADVIL) 600 MG tablet take 1 tablet po pc every 6 hours for 5 days the prn-post operative pain 05/22/19   Earnstine Regal, PA-C  lansoprazole (PREVACID) 15 MG capsule Take 15 mg by mouth daily before breakfast.     [provider]  levothyroxine (SYNTHROID) 200  MCG tablet Take 200 mcg by mouth daily before breakfast. 04/03/19   [provider]  oxyCODONE (ROXICODONE) 5 MG immediate release tablet take 1 tablet po every 6 hours prn for breakthrough post operative pain 05/22/19   Earnstine Regal, PA-C  potassium chloride SA (KLOR-CON) 20 MEQ tablet Take 20 mEq by mouth daily.    [provider]  torsemide (DEMADEX) 20 MG tablet Take 20 mg by mouth daily at 6 PM.     [provider]  triamterene-hydrochlorothiazide (MAXZIDE-25) 37.5-25 MG tablet Take 1 tablet by mouth daily.    [provider]      Allergies    Hydrocodone-acetaminophen, Nsaids, Other, Oxycodone, and Penicillins    Review of Systems   Review of Systems  Physical Exam Updated Vital Signs BP 134/73   Pulse 88   Temp 97.8 F (36.6 C) (Oral)   Resp 16   SpO2 97%  Physical Exam Vitals and nursing note reviewed.  Constitutional:      General: She is not in acute distress.    Appearance: She is well-developed.  HENT:     Head: Normocephalic and atraumatic.     Mouth/Throat:     Mouth: Mucous membranes are moist.  Eyes:     General: Vision grossly intact. Gaze aligned appropriately.     Extraocular Movements: Extraocular movements intact.     Conjunctiva/sclera: Conjunctivae normal.  Cardiovascular:  Rate and Rhythm: Normal rate and regular rhythm.     Pulses: Normal pulses.     Heart sounds: Normal heart sounds, S1 normal and S2 normal. No murmur heard.    No friction rub. No gallop.  Pulmonary:     Effort: Pulmonary effort is normal. No respiratory distress.     Breath sounds: Normal breath sounds.  Abdominal:     General: Bowel sounds are normal.     Palpations: Abdomen is soft.     Tenderness: There is no abdominal tenderness. There is no guarding or rebound.     Hernia: No hernia is present.  Musculoskeletal:        General: No swelling.     Cervical back: Full passive range of motion without pain, normal range of motion and neck  supple. No spinous process tenderness or muscular tenderness. Normal range of motion.     Right lower leg: No edema.     Left lower leg: No edema.  Skin:    General: Skin is warm and dry.     Capillary Refill: Capillary refill takes less than 2 seconds.     Findings: No ecchymosis, erythema, rash or wound.  Neurological:     General: No focal deficit present.     Mental Status: She is alert and oriented to person, place, and time.     GCS: GCS eye subscore is 4. GCS verbal subscore is 5. GCS motor subscore is 6.     Cranial Nerves: Cranial nerves 2-12 are intact.     Sensory: Sensation is intact.     Motor: Motor function is intact.     Coordination: Coordination is intact.  Psychiatric:        Attention and Perception: Attention normal.        Mood and Affect: Mood normal.        Speech: Speech normal.        Behavior: Behavior normal.     ED Results / Procedures / Treatments   Labs (all labs ordered are listed, but only abnormal results are displayed) Labs Reviewed  CBC WITH DIFFERENTIAL/PLATELET  COMPREHENSIVE METABOLIC PANEL  TROPONIN I (HIGH SENSITIVITY)  TROPONIN I (HIGH SENSITIVITY)    EKG EKG Interpretation  Date/Time:  Tuesday February 14 2022 22:49:46 EST Ventricular Rate:  94 PR Interval:  162 QRS Duration: 101 QT Interval:  375 QTC Calculation: 469 R Axis:   47 Text Interpretation: Sinus rhythm Normal ECG Confirmed by Orpah Greek [id number]) on 02/15/2022 12:41:18 AM  Radiology No results found.  Procedures Procedures    Medications Ordered in ED Medications  diphenhydrAMINE (BENADRYL) injection 25 mg (has no administration in time range)  sodium chloride 0.9 % bolus 1,000 mL (has no administration in time range)    ED Course/ Medical Decision Making/ A&P                           Medical Decision Making  Presents to the emergency department for evaluation of possible allergic reaction.  Patient was treated for allergic reaction to a  chemotherapy agent earlier today while at [facility].  A new agent was started at discharge and she was continuously infusing at home.  She had recurrence of the symptoms.  It is difficult to determine if this is a true allergic reaction.  Further, it is difficult to tell if this is recurrence of the symptoms from the infusion earlier today or a reaction to the new  medication.  She does report that her symptoms stopped after she stopped the infusion.  I discussed her care with Dr. Maylon Peppers, on-call for oncology at [facility].  He did agree with the accessing the port.  He will put a note in the chart for her primary oncologist, Dr. Emeterio Reeve who can follow-up with the patient and determine when to reinitiate treatment.  Patient will be provided with Medrol dose pack and instructed to take 25 mg of Benadryl every 6 hours for the next 2 days to prevent any recurrence of possible allergic reaction.        Final Clinical Impression(s) / ED Diagnoses Final diagnoses:  Allergic reaction, initial encounter    Rx / DC Orders ED Discharge Orders          Ordered    methylPREDNISolone (MEDROL DOSEPAK) 4 MG TBPK tablet        02/15/22 0138              Orpah Greek, MD 02/15/22 0139

## 2022-02-17 ENCOUNTER — Telehealth: Payer: Self-pay

## 2022-02-17 NOTE — Telephone Encounter (Signed)
     Patient  visit on 11/29  at [facility] you been able to follow up with your primary care physician? Yes   The patient was or was not able to obtain any needed medicine or equipment. Yes   Are there diet recommendations that you are having difficulty following?  Patient expresses understanding of discharge instructions and education provided has no other needs at this time.      [place], Care Management  [phone] [address], [place], [place] [address] Phone: [phone] Email: [email or web address]

## 2023-05-07 ENCOUNTER — Emergency Department (HOSPITAL_BASED_OUTPATIENT_CLINIC_OR_DEPARTMENT_OTHER): Payer: Medicare Other | Admitting: Radiology

## 2023-05-07 ENCOUNTER — Other Ambulatory Visit: Payer: Self-pay

## 2023-05-07 ENCOUNTER — Encounter (HOSPITAL_BASED_OUTPATIENT_CLINIC_OR_DEPARTMENT_OTHER): Payer: Self-pay | Admitting: *Deleted

## 2023-05-07 ENCOUNTER — Emergency Department (HOSPITAL_BASED_OUTPATIENT_CLINIC_OR_DEPARTMENT_OTHER): Payer: Medicare Other

## 2023-05-07 ENCOUNTER — Emergency Department (HOSPITAL_BASED_OUTPATIENT_CLINIC_OR_DEPARTMENT_OTHER)
Admission: EM | Admit: 2023-05-07 | Discharge: 2023-05-07 | Disposition: A | Discharge status: Home / Self Care | Payer: Medicare Other | Attending: Emergency Medicine | Admitting: Emergency Medicine

## 2023-05-07 DIAGNOSIS — Z79899 Other long term (current) drug therapy: Secondary | ICD-10-CM | POA: Diagnosis not present

## 2023-05-07 DIAGNOSIS — R079 Chest pain, unspecified: Secondary | ICD-10-CM | POA: Diagnosis present

## 2023-05-07 DIAGNOSIS — K209 Esophagitis, unspecified without bleeding: Secondary | ICD-10-CM | POA: Diagnosis not present

## 2023-05-07 LAB — BASIC METABOLIC PANEL
Anion gap: 10 (ref 5–15)
BUN: 18 mg/dL (ref 8–23)
CO2: 28 mmol/L (ref 22–32)
Calcium: 9.2 mg/dL (ref 8.9–10.3)
Chloride: 98 mmol/L (ref 98–111)
Creatinine, Ser: 0.84 mg/dL (ref 0.44–1.00)
GFR, Estimated: >60 mL/min (ref >60)
Glucose, Bld: 106 mg/dL — ABNORMAL HIGH (ref 70–99)
Potassium: 3.3 mmol/L — ABNORMAL LOW (ref 3.5–5.1)
Sodium: 136 mmol/L (ref 135–145)

## 2023-05-07 LAB — CBC
HCT: 34.7 % — ABNORMAL LOW (ref 36.0–46.0)
Hemoglobin: 11.7 g/dL — ABNORMAL LOW (ref 12.0–15.0)
MCH: 31.9 pg (ref 26.0–34.0)
MCHC: 33.7 g/dL (ref 30.0–36.0)
MCV: 94.6 fL (ref 80.0–100.0)
Platelets: 456 10*3/uL — ABNORMAL HIGH (ref 150–400)
RBC: 3.67 MIL/uL — ABNORMAL LOW (ref 3.87–5.11)
RDW: 13.9 % (ref 11.5–15.5)
WBC: 7.3 10*3/uL (ref 4.0–10.5)
nRBC: 0 % (ref 0.0–0.2)

## 2023-05-07 LAB — TROPONIN I (HIGH SENSITIVITY)
Troponin I (High Sensitivity): 3 ng/L (ref <18)
Troponin I (High Sensitivity): 3 ng/L (ref <18)

## 2023-05-07 MED ORDER — DIPHENHYDRAMINE HCL 12.5 MG/5ML PO ELIX
6.2500 mg | ORAL_SOLUTION | Freq: Once | ORAL | Status: AC
  Administered 2023-05-07: 6.25 mg via ORAL
  Filled 2023-05-07: qty 10

## 2023-05-07 MED ORDER — ALUM & MAG HYDROXIDE-SIMETH 200-200-20 MG/5ML PO SUSP
30.0000 mL | Freq: Once | ORAL | Status: AC
  Administered 2023-05-07: 30 mL via ORAL
  Filled 2023-05-07: qty 30

## 2023-05-07 MED ORDER — HEPARIN SOD (PORK) LOCK FLUSH 100 UNIT/ML IV SOLN
500.0000 [IU] | Freq: Once | INTRAVENOUS | Status: AC
  Administered 2023-05-07: 500 [IU]
  Filled 2023-05-07: qty 5

## 2023-05-07 MED ORDER — SODIUM CHLORIDE 0.9 % IV BOLUS
1000.0000 mL | Freq: Once | INTRAVENOUS | Status: AC
  Administered 2023-05-07: 1000 mL via INTRAVENOUS

## 2023-05-07 MED ORDER — ACETAMINOPHEN 325 MG PO TABS
650.0000 mg | ORAL_TABLET | Freq: Four times a day (QID) | ORAL | Status: DC | PRN
  Administered 2023-05-07: 650 mg via ORAL
  Filled 2023-05-07: qty 2

## 2023-05-07 NOTE — ED Notes (Addendum)
 RT side port accessed. Port Was not charted for patient

## 2023-05-07 NOTE — ED Triage Notes (Signed)
 Pt is here due to chest pain which began at 4am and is radiating to her back.  Pt is undergoing chemo for pancreatic cancer.

## 2023-05-07 NOTE — ED Notes (Signed)
 Pt began feeling faint after IV placement attempt without success in left wrist

## 2023-05-07 NOTE — Discharge Instructions (Addendum)
 Continue current medications.  Try mylanta to help with symptoms.

## 2023-05-07 NOTE — ED Notes (Signed)
 Pt continues to feel faint

## 2023-05-09 NOTE — ED Provider Notes (Signed)
 Carolyn [name] EMERGENCY DEPARTMENT AT [facility] Provider Note   CSN: [id number] Arrival date & time: 05/07/23  1402     History  Chief Complaint  Patient presents with   Chest Pain    Carolyn [name] is a 68 y.o. female.  Patient complains of pain in her mid chest.  Patient reports that she has esophagitis from the chemotherapy that she is receiving.  Patient reports that she has been having increasing pain.  Patient reports she began having increasing pain around 4 AM today.  Patient concerned about her heart.  Patient denies any fever or chills she denies any cough or congestion.  Patient denies any abdominal pain.  The history is provided by the patient and the spouse. No language interpreter was used.  Chest Pain Pain location:  Epigastric Pain quality: aching   Pain radiates to:  Does not radiate Pain severity:  Moderate Onset quality:  Gradual Timing:  Constant Progression:  Worsening Chronicity:  New Relieved by:  Nothing Worsened by:  Nothing Associated symptoms: no abdominal pain        Home Medications Prior to Admission medications   Medication Sig Start Date End Date Taking? Authorizing Provider  acetaminophen (TYLENOL) 500 MG tablet take #2 tablets every 6 hours for 5 days then prn-post operative pain 05/22/19   Henreitta Leber, PA-C  ALPRAZolam Prudy Feeler) 0.5 MG tablet Take 0.5 mg by mouth 3 (three) times daily as needed for anxiety.    [provider]  estradiol (ESTRACE) 0.1 MG/GM vaginal cream place 1 gram of  vaginal cream on finger and place inside of vagina every other day starting May 30, 2019 x 6 weeks 05/22/19   Henreitta Leber, PA-C  fenofibrate 160 MG tablet Take 160 mg by mouth daily.    [provider]  ibuprofen (ADVIL) 600 MG tablet take 1 tablet po pc every 6 hours for 5 days the prn-post operative pain 05/22/19   Henreitta Leber, PA-C  lansoprazole (PREVACID) 15 MG capsule Take 15 mg by mouth daily before breakfast.      [provider]  levothyroxine (SYNTHROID) 200 MCG tablet Take 200 mcg by mouth daily before breakfast. 04/03/19   [provider]  methylPREDNISolone (MEDROL DOSEPAK) 4 MG TBPK tablet As directed 02/15/22   Gilda Crease, MD  oxyCODONE (ROXICODONE) 5 MG immediate release tablet take 1 tablet po every 6 hours prn for breakthrough post operative pain 05/22/19   Henreitta Leber, PA-C  potassium chloride SA (KLOR-CON) 20 MEQ tablet Take 20 mEq by mouth daily.    [provider]  torsemide (DEMADEX) 20 MG tablet Take 20 mg by mouth daily at 6 PM.     [provider]  triamterene-hydrochlorothiazide (MAXZIDE-25) 37.5-25 MG tablet Take 1 tablet by mouth daily.    [provider]      Allergies    Hydrocodone-acetaminophen, Nsaids, Other, Oxycodone, and Penicillins    Review of Systems   Review of Systems  Cardiovascular:  Positive for chest pain.  Gastrointestinal:  Negative for abdominal pain.  All other systems reviewed and are negative.   Physical Exam Updated Vital Signs BP 127/75   Pulse 77   Temp 98 F (36.7 C)   Resp 18   Wt 51.7 kg   SpO2 100%   BMI 20.19 kg/m  Physical Exam Vitals and nursing note reviewed.  Constitutional:      Appearance: She is well-developed.  HENT:     Head: Normocephalic.  Cardiovascular:  Rate and Rhythm: Normal rate.     Heart sounds: Normal heart sounds.  Pulmonary:     Effort: Pulmonary effort is normal.     Breath sounds: Normal breath sounds.  Chest:     Chest wall: No mass or tenderness.  Abdominal:     General: Bowel sounds are normal. There is no distension.     Palpations: Abdomen is soft.  Musculoskeletal:        General: Normal range of motion.     Cervical back: Normal range of motion.  Skin:    General: Skin is warm.  Neurological:     General: No focal deficit present.     Mental Status: She is alert and oriented to person, place, and time.  Psychiatric:        Mood  and Affect: Mood normal.     ED Results / Procedures / Treatments   Labs (all labs ordered are listed, but only abnormal results are displayed) Labs Reviewed  BASIC METABOLIC PANEL - Abnormal; Notable for the following components:      Result Value   Potassium 3.3 (*)    Glucose, Bld 106 (*)    All other components within normal limits  CBC - Abnormal; Notable for the following components:   RBC 3.67 (*)    Hemoglobin 11.7 (*)    HCT 34.7 (*)    Platelets 456 (*)    All other components within normal limits  TROPONIN I (HIGH SENSITIVITY)  TROPONIN I (HIGH SENSITIVITY)    EKG EKG Interpretation Date/Time:  Monday May 07 2023 14:18:37 EST Ventricular Rate:  71 PR Interval:  140 QRS Duration:  88 QT Interval:  402 QTC Calculation: 436 R Axis:   84  Text Interpretation: Normal sinus rhythm Normal ECG When compared with ECG of 14-Feb-2022 22:49, PREVIOUS ECG IS PRESENT Baseline wander Confirmed by Gwyneth Sprout ([id number]) on 05/07/2023 5:16:02 PM  Radiology No results found.  Procedures Procedures    Medications Ordered in ED Medications  sodium chloride 0.9 % bolus 1,000 mL (0 mLs Intravenous Stopped 05/07/23 1643)  alum & mag hydroxide-simeth (MAALOX/MYLANTA) 200-200-20 MG/5ML suspension 30 mL (30 mLs Oral Given 05/07/23 2027)  diphenhydrAMINE (BENADRYL) 12.5 MG/5ML elixir 6.25 mg (6.25 mg Oral Given 05/07/23 2026)  heparin lock flush 100 unit/mL (500 Units Intracatheter Given 05/07/23 2028)    ED Course/ Medical Decision Making/ A&P                                 Medical Decision Making Patient complains of pain in the esophageal area.  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  Amount and/or Complexity of Data Reviewed Independent Historian: spouse    Details: Patient is here with her spouse who is supportive. External Data Reviewed: notes.    Details: On allergy notes reviewed. Labs: ordered. Decision-making details  documented in ED Course.    Details: Labs ordered reviewed and interpreted troponin is negative x 2. Radiology: ordered and independent interpretation performed. Decision-making details documented in ED Course.    Details: Chest x-ray shows no acute cardiopulmonary disease. ECG/medicine tests: ordered and independent interpretation performed. Decision-making details documented in ED Course.    Details: Normal sinus rhythm rate of 71 no acute findings.  Risk OTC drugs. Risk Details: Patient offered GI cocktail.  Patient does not want to take lidocaine because she thinks it will make her feel like she  cannot breathe.  Patient given Mylanta and Benadryl to help with esophagitis.  Patient is counseled on cardiac evaluation.  Patient declines CT angio of her chest patient is not concerned that she could have a blood clot.  Patient is advised to schedule follow-up with her primary care physician.           Final Clinical Impression(s) / ED Diagnoses Final diagnoses:  Esophagitis    Rx / DC Orders ED Discharge Orders     None      An After Visit Summary was printed and given to the patient.    Elson Areas, Cordelia Poche 05/09/23 2322    Gwyneth Sprout, MD 05/11/23 0830
